# Patient Record
Sex: Female | Born: 1980 | Race: Black or African American | Hispanic: No | Marital: Married | Smoking: Never smoker
Health system: Southern US, Community
[De-identification: ages and names within clinical notes are randomized; demographics above are authoritative.]

## PROBLEM LIST (undated history)

## (undated) DIAGNOSIS — K439 Ventral hernia without obstruction or gangrene: Secondary | ICD-10-CM

## (undated) HISTORY — PX: BREAST SURGERY: SHX581

---
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
## 2001-06-17 ENCOUNTER — Encounter: Admission: RE | Admit: 2001-06-17 | Discharge: 2001-06-17 | Payer: Self-pay | Admitting: General Surgery

## 2001-06-17 ENCOUNTER — Encounter (HOSPITAL_BASED_OUTPATIENT_CLINIC_OR_DEPARTMENT_OTHER): Payer: Self-pay | Admitting: General Surgery

## 2001-06-19 ENCOUNTER — Ambulatory Visit (HOSPITAL_COMMUNITY): Admission: RE | Admit: 2001-06-19 | Discharge: 2001-06-19 | Payer: Self-pay | Admitting: Obstetrics & Gynecology

## 2001-07-29 ENCOUNTER — Ambulatory Visit (HOSPITAL_COMMUNITY): Admission: RE | Admit: 2001-07-29 | Discharge: 2001-07-29 | Payer: Self-pay | Admitting: Obstetrics

## 2001-12-18 ENCOUNTER — Encounter (HOSPITAL_COMMUNITY): Admission: AD | Admit: 2001-12-18 | Discharge: 2001-12-18 | Payer: Self-pay | Admitting: *Deleted

## 2001-12-23 ENCOUNTER — Inpatient Hospital Stay (HOSPITAL_COMMUNITY): Admission: AD | Admit: 2001-12-23 | Discharge: 2001-12-23 | Payer: Self-pay | Admitting: *Deleted

## 2001-12-24 ENCOUNTER — Inpatient Hospital Stay (HOSPITAL_COMMUNITY): Admission: AD | Admit: 2001-12-24 | Discharge: 2001-12-26 | Payer: Self-pay | Admitting: *Deleted

## 2004-11-26 ENCOUNTER — Encounter: Admission: RE | Admit: 2004-11-26 | Discharge: 2004-11-26 | Payer: Self-pay | Admitting: Occupational Medicine

## 2005-10-24 ENCOUNTER — Encounter: Admission: RE | Admit: 2005-10-24 | Discharge: 2005-10-24 | Payer: Self-pay | Admitting: Family Medicine

## 2005-12-11 ENCOUNTER — Encounter (INDEPENDENT_AMBULATORY_CARE_PROVIDER_SITE_OTHER): Payer: Self-pay | Admitting: *Deleted

## 2005-12-11 ENCOUNTER — Ambulatory Visit (HOSPITAL_BASED_OUTPATIENT_CLINIC_OR_DEPARTMENT_OTHER): Admission: RE | Admit: 2005-12-11 | Discharge: 2005-12-11 | Payer: Self-pay | Admitting: Surgery

## 2006-09-05 ENCOUNTER — Inpatient Hospital Stay (HOSPITAL_COMMUNITY): Admission: AD | Admit: 2006-09-05 | Discharge: 2006-09-06 | Payer: Self-pay | Admitting: Obstetrics and Gynecology

## 2007-02-28 ENCOUNTER — Inpatient Hospital Stay (HOSPITAL_COMMUNITY): Admission: AD | Admit: 2007-02-28 | Discharge: 2007-02-28 | Payer: Self-pay | Admitting: Obstetrics and Gynecology

## 2007-03-04 ENCOUNTER — Inpatient Hospital Stay (HOSPITAL_COMMUNITY): Admission: AD | Admit: 2007-03-04 | Discharge: 2007-03-06 | Payer: Self-pay | Admitting: Obstetrics and Gynecology

## 2007-04-10 ENCOUNTER — Ambulatory Visit (HOSPITAL_COMMUNITY): Admission: RE | Admit: 2007-04-10 | Discharge: 2007-04-10 | Payer: Self-pay | Admitting: Obstetrics and Gynecology

## 2008-05-03 ENCOUNTER — Emergency Department (HOSPITAL_COMMUNITY): Admission: EM | Admit: 2008-05-03 | Discharge: 2008-05-03 | Payer: Self-pay | Admitting: Emergency Medicine

## 2011-03-19 NOTE — Op Note (Signed)
[name], Caitlin Costa NO.:  [id number]   MEDICAL RECORD NO.:  [id number]          PATIENT TYPE:  AMB   LOCATION:  SDC                           FACILITY:  [facility]   PHYSICIAN:  Osborn Coho, M.D.   DATE OF BIRTH:  04-Mar-1981   DATE OF PROCEDURE:  04/10/2007  DATE OF DISCHARGE:                               OPERATIVE REPORT   PREOPERATIVE DIAGNOSIS:  Desires permanent sterilization.   POSTOPERATIVE DIAGNOSIS:  Desires permanent sterilization.   PROCEDURE:  Laparoscopic sterilization/BTL.   ATTENDING:  Osborn Coho, M.D.   ANESTHESIA:  General.   FLUIDS:  1000 mL   URINE OUTPUT:  Approximately 500 mL via straight cath prior to  procedure.   ESTIMATED BLOOD LOSS:  Minimal.   COMPLICATIONS:  None.   FINDINGS:  Normal bilateral fallopian tubes.  Normal-appearing left  ovary, unable to visualize as right ovary.   PROCEDURE:  The patient was taken to the operating room after risks,  benefits and alternatives reviewed with the patient.  The patient  verbalized understanding, consent signed and witnessed.  The patient was  placed under general anesthesia and prepped and draped in the normal  sterile fashion in the dorsal lithotomy position.  A bivalve speculum  placed in the patient's vagina and a Hulka tenaculum placed for  intrauterine manipulation. Attention was then turned to the abdomen  where a 10 mm vertical incision was made at the umbilicus after  injecting with 10 mL of 0.5% Marcaine. A 10 mm incision was made at the  umbilicus.  The Veress needle was introduced into the intra-abdominal  cavity and pneumoperitoneum achieved.  A 10-mm trocar was advanced into  the intra-abdominal cavity and laparoscope introduced as well.  The  patient was placed in Trendelenburg and with a probe bilateral adnexa  were attempted to be visualized. However, unable to elevate the uterus  enough to visualize the right ovary. However the right tube was able to  be lifted  up and the fimbriated end visualized.  Once bilateral fimbria  visualized the Kleppinger was used to cauterize for three consecutive  burns the right fallopian tube in the isthmic portion.  This was done  for at least three consecutive burns.  The same was done on the left  fallopian tube.  There was good hemostasis.  The patient was taken out  of Trendelenburg and pneumoperitoneum released.  The umbilical trocar  was removed under direct visualization and the fascia repaired at the  umbilicus using 0 Vicryl via a running stitch.  The  skin was repaired using 3-0 Monocryl via a subcuticular stitch.  Covaderm was placed.  Sponge, lap and needle count was correct.  A Hulka  tenaculum was removed.  The patient tolerated procedure well and was  awaiting transfer to the recovery room in good condition.      Osborn Coho, M.D.  Electronically Signed     AR/MEDQ  D:  04/10/2007  T:  04/10/2007  Job:  [id number]

## 2011-03-22 NOTE — H&P (Signed)
[name], Caitlin Costa               ACCOUNT NO.:  [id number]   MEDICAL RECORD NO.:  [id number]          PATIENT TYPE:  MAT   LOCATION:  MATC                          FACILITY:  [facility]   PHYSICIAN:  Naima A. Dillard, M.D. DATE OF BIRTH:  12-01-1980   DATE OF ADMISSION:  02/28/2007  DATE OF DISCHARGE:  02/28/2007                              HISTORY & PHYSICAL   Caitlin Costa is a 30 year old gravida 2, para 1-0-0-1 who presents at 40-  2/7 weeks, EDD March 02, 2007 as determined by dates and confirmed with  first trimester ultrasound.  She presents with contractions increasing  in frequency and intensity since 8 o'clock this morning.  She reports  positive fetal movement.  No vaginal bleeding.  No rupture of membranes.  She denies any headache, visual changes or epigastric pain.  Her  pregnancy has been followed by the [facility] at [facility] and is remarkable  for:   1. First trimester spotting.  2. History of rapid labor.  3. Group B strep negative.  4. Positive chlamydia, treated with Zithromax on February 11, 2007.  Test      of cure was done today on admission.   This patient presented to the office of [facility] for prenatal care on February 21, 2006 at [redacted] weeks gestation.  EDC determined by dates and confirmed  with ultrasound.  Her pregnancy has been essentially unremarkable.  She  has been size equal to dates.  Normotensive with no proteinuria.  Height  is 5 foot 10 inches.  Pre-gravid weight is 198.  Last recorded pregnancy  weight is 257.  She has no known drug allergies and denies the use of  tobacco, alcohol or illicit drugs.   OB HISTORY:  In 2003 the patient had a normal spontaneous vaginal  delivery at term with the birth of a 7 pound 4 ounce female infant named  Caitlin Costa, weight 7 pounds 4 ounces with no complications.  This is her  second and current pregnancy.   MEDICAL HISTORY:  The patient had a right breast lump removed in January  of 2007, which was benign.   FAMILY HISTORY:   Mother, stomach cancer, deceased at age 45.   GENETIC HISTORY:  Unremarkable.  There is no family history of familial  or chromosomal disorders, children that were born with birth defects or  any that died in infancy.   SOCIAL HISTORY:  Ms. Caitlin Costa is a married African-American female.  Her  husband, Caitlin Costa, is involved and supportive.  They are [place]  in their faith.   REVIEW OF SYSTEMS:  As described above.  The patient is typical of one  with a uterine pregnancy at term in early active labor.   PHYSICAL EXAMINATION:  VITAL SIGNS:  Stable.  Afebrile.  HEENT:  Unremarkable.  HEART:  Regular rate and rhythm.  LUNGS:  Clear.  ABDOMEN:  Gravid in its contour.  Uterine fundus is noted to extend 40  cm above the level of the pubic symphysis.  Leopold's maneuver finds the  infant to be in a longitudinal lie, cephalic presentation.  Estimated  fetal weight is 7-1/2 pounds.  The baseline of the fetal heart rate  monitor is 140s with average long-term variability.  Reactivity is  present with no periodic changes.  The patient is contracting every 2-3  minutes.  GYN:  Digital exam of the cervix finds it to be 4 cm dilated, 90%  effaced with the cephalic presenting part at a -1 station and a bulging  bag of water.  EXTREMITIES:  Show no pathologic edema.  DTRs are 1+ with no clonus.  There is no calf tenderness noted bilaterally.   ASSESSMENT:  Intrauterine pregnancy at term.  Active labor.   PLAN:  Admit per Dr. Jaymes Graff.  May have epidural as desired for  pain relief.  Anticipate spontaneous vaginal delivery.      Rica Koyanagi, C.N.M.      Naima A. Normand Sloop, M.D.  Electronically Signed    SDM/MEDQ  D:  03/04/2007  T:  03/04/2007  Job:  [id number]

## 2011-03-22 NOTE — Op Note (Signed)
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   OPERATION:  Excision right breast mass (partial mastectomy).   SURGEON:  Currie Paris, M.D.   ANESTHESIA:  General.   CLINICAL HISTORY:  This is a 30 year old with an extremely large fairly well  circumscribed right breast mass that has occupied most of the upper inner  quadrant of her right breast. 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   DESCRIPTION OF PROCEDURE:  The patient was seen in the holding area, and she  had no further questions. She and I both identified and marked the right  breast as the operative side. She was taken to the operating room and prior  to being given any anesthesia the mass was palpated, identified, and marked.  The patient then underwent a satisfactory general anesthesia (LMA). The  breast was then prepped and draped and the time-out occurred.   I made a slightly curving, but mainly transverse incision, in the upper  inner quadrant of the right breast staying below the lowest edge of the  palpable mass. As soon as I got through the skin and subcutaneous tissue the  mass was visible. I basically stripped it off of the subcutaneous tissue  superficially, especially going superior, then I was able to go around it  medial and using cautery divided it from where it seemed to be attached to  breast tissue, but it was  basically a self-contained mass. Some bleeders  were cauterized, others suture-ligated or tied as needed. The mass came out  apparently intact and en toto.   Once this was out, it was sent to pathology. The wound was checked for  hemostasis and once I thought everything was dry, injected with some  Marcaine to help with postoperative analgesia. I did close back the breast  tissue where there seemed to be some breast tissue both above-and-below  where this mass has and really occupied the entire one-fourth of the breast.  I did this to also try to tack this down to reduce the dead space and  hopefully reduce the chance  of a large seroma developing. The subcu was closed with some 3-0 Vicryl and  the skin with a 4-0 Monocryl subcuticular plus Dermabond.   The patient tolerated the procedure well. There no operative complications.  All counts were correct.      Currie Paris, M.D.  Electronically Signed     CJS/MEDQ  D:  12/11/2005  T:  12/11/2005  Job:  [id number]   cc:   Ernestina Penna, M.D.  Fax: [phone]

## 2011-08-22 LAB — CBC
HCT: 39.3
Hemoglobin: 13.1
MCHC: 33.3
RBC: 4.68

## 2011-11-29 ENCOUNTER — Emergency Department (HOSPITAL_BASED_OUTPATIENT_CLINIC_OR_DEPARTMENT_OTHER)
Admission: EM | Admit: 2011-11-29 | Discharge: 2011-11-29 | Disposition: A | Discharge status: Home / Self Care | Payer: 59 | Attending: Emergency Medicine | Admitting: Emergency Medicine

## 2011-11-29 ENCOUNTER — Encounter (HOSPITAL_BASED_OUTPATIENT_CLINIC_OR_DEPARTMENT_OTHER): Payer: Self-pay | Admitting: *Deleted

## 2011-11-29 DIAGNOSIS — K120 Recurrent oral aphthae: Secondary | ICD-10-CM

## 2011-11-29 DIAGNOSIS — K1370 Unspecified lesions of oral mucosa: Secondary | ICD-10-CM | POA: Insufficient documentation

## 2011-11-29 MED ORDER — CHLORHEXIDINE GLUCONATE 0.12 % MT SOLN: 15.0000 mL | Freq: Two times a day (BID) | OROMUCOSAL | Status: AC

## 2011-11-29 MED ORDER — LIDOCAINE VISCOUS 2 % MT SOLN: 5.0000 mL | Freq: Four times a day (QID) | OROMUCOSAL | Status: AC | PRN

## 2011-11-29 MED ORDER — IBUPROFEN 800 MG PO TABS
800.0000 mg | ORAL_TABLET | Freq: Once | ORAL | Status: AC
  Administered 2011-11-29: 800 mg via ORAL
  Filled 2011-11-29: qty 1

## 2011-11-29 NOTE — ED Notes (Signed)
Pt c/o canker sores inside mouth x 5 days

## 2011-11-29 NOTE — ED Notes (Signed)
MD at bedside. 

## 2011-11-29 NOTE — ED Provider Notes (Signed)
History     CSN: [id number]  Arrival date & time 11/29/11  2300   First MD Initiated Contact with Patient 11/29/11 2306      Chief Complaint  Patient presents with  . Mouth Lesions    (Consider location/radiation/quality/duration/timing/severity/associated sxs/prior treatment) Patient is a 31 y.o. female presenting with mouth sores. The history is provided by the patient.  Mouth Lesions  The current episode started 5 to 7 days ago. The problem occurs continuously. The problem has been unchanged. The problem is moderate. The symptoms are relieved by one or more OTC medications. The symptoms are aggravated by eating and drinking. Associated symptoms include mouth sores and swollen glands. Pertinent negatives include no congestion, no headaches, no rhinorrhea, no sore throat and no rash.    History reviewed. No pertinent past medical history.  Past Surgical History  Procedure Date  . Breast surgery     History reviewed. No pertinent family history.  History  Substance Use Topics  . Smoking status: Never Smoker   . Smokeless tobacco: Not on file  . Alcohol Use: No    OB History    Grav Para Term Preterm Abortions TAB SAB Ect Mult Living                  Review of Systems  HENT: Positive for mouth sores. Negative for congestion, sore throat and rhinorrhea.   Skin: Negative for pallor and rash.  Neurological: Negative for headaches.    Allergies  Review of patient's allergies indicates no known allergies.  Home Medications   Current Outpatient Rx  Name Route Sig Dispense Refill  . CHLORHEXIDINE GLUCONATE 0.12 % MT SOLN Mouth/Throat Use as directed 15 mLs in the mouth or throat 2 (two) times daily. 120 mL 0  . LIDOCAINE VISCOUS 2 % MT SOLN Oral Take 5 mLs by mouth 4 (four) times daily as needed for pain. Swish and spit out the solution before eating or drinking for the painful oral ulcers 100 mL 0    BP 107/84  Pulse 65  Temp(Src) 99.1 F (37.3 C) (Oral)  Resp  16  Ht 5\' 10"  (1.778 m)  Wt 192 lb (87.091 kg)  BMI 27.55 kg/m2  SpO2 100%  Physical Exam  Nursing note and vitals reviewed. Constitutional: She appears well-developed and well-nourished. No distress.  HENT:  Head: Normocephalic and atraumatic. No trismus in the jaw.  Right Ear: External ear normal.  Left Ear: External ear normal.  Mouth/Throat: Oral lesions present. Normal dentition. No uvula swelling or dental caries. No oropharyngeal exudate.       Erythematous ulcers on mucosa, gingiva margins irritated as well  Eyes: Conjunctivae are normal. Right eye exhibits no discharge. Left eye exhibits no discharge. No scleral icterus.  Neck: Neck supple. No tracheal deviation present.  Cardiovascular: Normal rate.   Pulmonary/Chest: Effort normal. No stridor. No respiratory distress.  Musculoskeletal: She exhibits no edema.  Neurological: She is alert. Cranial nerve deficit: no gross deficits.  Skin: Skin is warm and dry. No rash noted.  Psychiatric: She has a normal mood and affect.    ED Course  Procedures (including critical care time)  Labs Reviewed - No data to display No results found.   1. Canker sore       MDM  Pt treated with viscous lidocaine swish and spit.  Chlorhexidine mouth wash as well for symptomatic relief.  Likely viral.       Celene Kras, MD 11/29/11 [phone]

## 2014-06-17 ENCOUNTER — Emergency Department (HOSPITAL_BASED_OUTPATIENT_CLINIC_OR_DEPARTMENT_OTHER)
Admission: EM | Admit: 2014-06-17 | Discharge: 2014-06-18 | Disposition: A | Discharge status: Home / Self Care | Attending: Emergency Medicine | Admitting: Emergency Medicine

## 2014-06-17 ENCOUNTER — Encounter (HOSPITAL_BASED_OUTPATIENT_CLINIC_OR_DEPARTMENT_OTHER): Payer: Self-pay | Admitting: Emergency Medicine

## 2014-06-17 DIAGNOSIS — S8391XA Sprain of unspecified site of right knee, initial encounter: Secondary | ICD-10-CM

## 2014-06-17 DIAGNOSIS — Y990 Civilian activity done for income or pay: Secondary | ICD-10-CM | POA: Insufficient documentation

## 2014-06-17 DIAGNOSIS — S99929A Unspecified injury of unspecified foot, initial encounter: Secondary | ICD-10-CM

## 2014-06-17 DIAGNOSIS — S8990XA Unspecified injury of unspecified lower leg, initial encounter: Secondary | ICD-10-CM | POA: Diagnosis present

## 2014-06-17 DIAGNOSIS — Y9389 Activity, other specified: Secondary | ICD-10-CM | POA: Insufficient documentation

## 2014-06-17 DIAGNOSIS — Y9289 Other specified places as the place of occurrence of the external cause: Secondary | ICD-10-CM | POA: Diagnosis not present

## 2014-06-17 DIAGNOSIS — S99919A Unspecified injury of unspecified ankle, initial encounter: Secondary | ICD-10-CM

## 2014-06-17 DIAGNOSIS — X500XXA Overexertion from strenuous movement or load, initial encounter: Secondary | ICD-10-CM | POA: Insufficient documentation

## 2014-06-17 DIAGNOSIS — IMO0002 Reserved for concepts with insufficient information to code with codable children: Secondary | ICD-10-CM | POA: Insufficient documentation

## 2014-06-17 MED ORDER — NAPROXEN 250 MG PO TABS
500.0000 mg | ORAL_TABLET | Freq: Once | ORAL | Status: AC
  Administered 2014-06-17: 500 mg via ORAL
  Filled 2014-06-17: qty 2

## 2014-06-17 MED ORDER — HYDROCODONE-ACETAMINOPHEN 5-325 MG PO TABS: 1.0000 | ORAL_TABLET | Freq: Four times a day (QID) | ORAL | Status: DC | PRN

## 2014-06-17 NOTE — ED Provider Notes (Signed)
CSN: [id number]     Arrival date & time 06/17/14  2305 History  This chart was scribed for Hanley SeamenJohn L Kehaulani Fruin, MD by Julian HyMorgan Graham, ED Scribe. The patient was seen in MH09/MH09. The patient's care was started at 11:20 PM.     Chief Complaint  Patient presents with  . Knee Injury   The history is provided by the patient. No language interpreter was used.   HPI Comments: Caitlin Costa is a 33 y.o. female who presents to the Emergency Department complaining of right knee injury about 19 hours ago. Pt reports she was bending down at work and when she stood up she felt and heard a pop in her right knee. Pt denies any other imjury.  Pt reports she cannot bear any weight on her right knee without pain. Pt rates her pain as 8/10 when she bears weight. She describes the pain as being deep in her knee joint. Pt reports associated swelling earlier that has improved. Pt reports she used ice compress and elevation with minimal relief.    History reviewed. No pertinent past medical history. Past Surgical History  Procedure Laterality Date  . Breast surgery     History reviewed. No pertinent family history. History  Substance Use Topics  . Smoking status: Never Smoker   . Smokeless tobacco: Not on file  . Alcohol Use: No   OB History   Grav Para Term Preterm Abortions TAB SAB Ect Mult Living                 Review of Systems  Constitutional: Negative for fever and chills.  Gastrointestinal: Negative for nausea and vomiting.  Musculoskeletal: Positive for arthralgias (right knee) and joint swelling (right knee).  Neurological: Negative for weakness.  All other systems reviewed and are negative.  Allergies  Review of patient's allergies indicates no known allergies.  Home Medications   Prior to Admission medications   Medication Sig Start Date End Date Taking? Authorizing Provider  benzocaine (ORAJEL) 10 % mucosal gel Use as directed in the mouth or throat as needed. For mouth pain     Historical Provider, MD  HYDROcodone-acetaminophen (NORCO) 5-325 MG per tablet Take 1-2 tablets by mouth every 6 (six) hours as needed (for pain). 06/17/14   Carlisle BeersJohn L Everley Evora, MD  Multiple Vitamin (MULITIVITAMIN WITH MINERALS) TABS Take 1 tablet by mouth daily.    Historical Provider, MD   Triage Vitals: BP 122/62  Pulse 51  Temp(Src) 98.1 F (36.7 C) (Oral)  Resp 16  Ht 5\' 10"  (1.778 m)  Wt 212 lb (96.163 kg)  BMI 30.42 kg/m2  SpO2 100%  LMP 05/19/2014 Physical Exam  Nursing note and vitals reviewed. Constitutional: She is oriented to person, place, and time. She appears well-developed and well-nourished. No distress.  HENT:  Head: Normocephalic and atraumatic.  Eyes: Conjunctivae and EOM are normal.  Cardiovascular: Normal rate, regular rhythm and normal heart sounds.   Pulmonary/Chest: Effort normal and breath sounds normal. No stridor. No respiratory distress.  Abdominal: Soft. Bowel sounds are normal. She exhibits no distension.  Musculoskeletal: She exhibits no edema.  Pedal pulses are normal. She has mild pain on anterior and posterior drawer tests and on lateral stress without instability. No effusion appreciated.   Neurological: She is alert and oriented to person, place, and time. No cranial nerve deficit.  Skin: Skin is warm and dry.  Psychiatric: She has a normal mood and affect.    ED Course  Procedures (including critical care  time) DIAGNOSTIC STUDIES: Oxygen Saturation is 100% on RA, normal by my interpretation.    COORDINATION OF CARE: 11:26 PM- Patient informed of current plan for treatment and evaluation and agrees with plan at this time.   MDM   Final diagnoses:  Sprain of right knee, initial encounter   I personally performed the services described in this documentation, which was scribed in my presence. The recorded information has been reviewed and is accurate.   Hanley Seamen, MD 06/17/14 [phone]

## 2014-06-17 NOTE — ED Notes (Signed)
Pt states she was bending down last night at work and when she went to stand she heard a pop sound in her right knee, this afternoon she noticed it was swollen and painful to walk on

## 2014-07-26 ENCOUNTER — Ambulatory Visit: Attending: Orthopaedic Surgery | Admitting: Physical Therapy

## 2014-07-26 DIAGNOSIS — IMO0001 Reserved for inherently not codable concepts without codable children: Secondary | ICD-10-CM | POA: Insufficient documentation

## 2014-07-26 DIAGNOSIS — M2240 Chondromalacia patellae, unspecified knee: Secondary | ICD-10-CM | POA: Diagnosis not present

## 2014-07-26 DIAGNOSIS — M25569 Pain in unspecified knee: Secondary | ICD-10-CM | POA: Diagnosis not present

## 2014-07-26 DIAGNOSIS — M238X9 Other internal derangements of unspecified knee: Secondary | ICD-10-CM | POA: Diagnosis not present

## 2014-08-03 ENCOUNTER — Ambulatory Visit: Admitting: Rehabilitation

## 2014-08-09 ENCOUNTER — Ambulatory Visit: Payer: Worker's Compensation | Attending: Orthopaedic Surgery | Admitting: Physical Therapy

## 2014-08-12 ENCOUNTER — Ambulatory Visit: Payer: Worker's Compensation

## 2014-08-16 ENCOUNTER — Ambulatory Visit: Payer: Worker's Compensation | Admitting: Rehabilitation

## 2014-08-19 ENCOUNTER — Ambulatory Visit: Payer: Worker's Compensation | Admitting: Physical Therapy

## 2015-01-25 ENCOUNTER — Encounter (HOSPITAL_BASED_OUTPATIENT_CLINIC_OR_DEPARTMENT_OTHER): Payer: Self-pay | Admitting: Emergency Medicine

## 2015-01-25 ENCOUNTER — Emergency Department (HOSPITAL_BASED_OUTPATIENT_CLINIC_OR_DEPARTMENT_OTHER)
Admission: EM | Admit: 2015-01-25 | Discharge: 2015-01-25 | Disposition: A | Discharge status: Home / Self Care | Payer: Federal, State, Local not specified - PPO | Attending: Emergency Medicine | Admitting: Emergency Medicine

## 2015-01-25 DIAGNOSIS — Z79899 Other long term (current) drug therapy: Secondary | ICD-10-CM | POA: Insufficient documentation

## 2015-01-25 DIAGNOSIS — K439 Ventral hernia without obstruction or gangrene: Secondary | ICD-10-CM

## 2015-01-25 DIAGNOSIS — R1033 Periumbilical pain: Secondary | ICD-10-CM | POA: Diagnosis present

## 2015-01-25 MED ORDER — ONDANSETRON HCL 4 MG/2ML IJ SOLN
4.0000 mg | Freq: Once | INTRAMUSCULAR | Status: AC
  Administered 2015-01-25: 4 mg via INTRAVENOUS
  Filled 2015-01-25: qty 2

## 2015-01-25 MED ORDER — HYDROMORPHONE HCL 1 MG/ML IJ SOLN
1.0000 mg | Freq: Once | INTRAMUSCULAR | Status: AC
  Administered 2015-01-25: 1 mg via INTRAVENOUS
  Filled 2015-01-25: qty 1

## 2015-01-25 NOTE — ED Notes (Signed)
Pt reports that she has had an areas in her mid stomach area that felt weird for the last 1 1/2 years, last night the area got very hard feeling and she is having abdominal pain

## 2015-01-25 NOTE — ED Provider Notes (Signed)
CSN: [id number]     Arrival date & time 01/25/15  0510 History   First MD Initiated Contact with Patient 01/25/15 [phone]     Chief Complaint  Patient presents with  . Abdominal Pain     (Consider location/radiation/quality/duration/timing/severity/associated sxs/prior Treatment) HPI  This is a 34 year old female with a history of a supraumbilical mass past year and a half. It had never been painful in the past. This morning it became more prominent and became painful. She rates the pain as severe, worse with palpation or movement. She is nauseated but has had no vomiting. She has had no diarrhea. Her abdomen is not otherwise distended.  History reviewed. No pertinent past medical history. Past Surgical History  Procedure Laterality Date  . Breast surgery     History reviewed. No pertinent family history. History  Substance Use Topics  . Smoking status: Never Smoker   . Smokeless tobacco: Not on file  . Alcohol Use: No   OB History    No data available     Review of Systems  All other systems reviewed and are negative.   Allergies  Review of patient's allergies indicates no known allergies.  Home Medications   Prior to Admission medications   Medication Sig Start Date End Date Taking? Authorizing Provider  benzocaine (ORAJEL) 10 % mucosal gel Use as directed in the mouth or throat as needed. For mouth pain    Historical Provider, MD  HYDROcodone-acetaminophen (NORCO) 5-325 MG per tablet Take 1-2 tablets by mouth every 6 (six) hours as needed (for pain). 06/17/14   Shanti Agresti, MD  Multiple Vitamin (MULITIVITAMIN WITH MINERALS) TABS Take 1 tablet by mouth daily.    Historical Provider, MD   BP 136/61 mmHg  Pulse 62  Temp(Src) 98.1 F (36.7 C) (Oral)  Resp 18  Ht 5\' 10"  (1.778 m)  Wt 215 lb (97.523 kg)  BMI 30.85 kg/m2  SpO2 100%  LMP 01/25/2015   Physical Exam  General: Well-developed, well-nourished female in no acute distress; appearance consistent with age of  record HENT: normocephalic; atraumatic Eyes: pupils equal, round and reactive to light; extraocular muscles intact Neck: supple Heart: regular rate and rhythm Lungs: clear to auscultation bilaterally Abdomen: soft; nondistended; nontender; tender supraumbilical mass consistent with hernia; bowel sounds present Extremities: No deformity; full range of motion; pulses normal Neurologic: Awake, alert and oriented; motor function intact in all extremities and symmetric; no facial droop Skin: Warm and dry Psychiatric: Normal mood and affect    ED Course  Procedures (including critical care time)  CLOSED REDUCTION After informed verbal consent was obtained the patient was given 4 milligrams of Zofran IV and 1 milligram of Dilaudid IV. The patient's supraumbilical ventral hernia was reduced using gentle pressure. A pop was felt as the hernia slid inside the abdomen. The patient tolerated this well and there were no immediate complications.  MDM  6:03 AM Hernia remains reduced. Pain resolved. Will refer to [facility] for definitive treatment.    Paula LibraJohn Ferlando Lia, MD 01/25/15 [phone]

## 2015-05-09 ENCOUNTER — Encounter (HOSPITAL_BASED_OUTPATIENT_CLINIC_OR_DEPARTMENT_OTHER): Payer: Self-pay | Admitting: *Deleted

## 2015-05-09 DIAGNOSIS — K439 Ventral hernia without obstruction or gangrene: Secondary | ICD-10-CM | POA: Diagnosis not present

## 2015-05-09 DIAGNOSIS — R109 Unspecified abdominal pain: Secondary | ICD-10-CM | POA: Diagnosis present

## 2015-05-09 DIAGNOSIS — R110 Nausea: Secondary | ICD-10-CM | POA: Diagnosis not present

## 2015-05-09 DIAGNOSIS — Z79899 Other long term (current) drug therapy: Secondary | ICD-10-CM | POA: Diagnosis not present

## 2015-05-09 NOTE — ED Notes (Signed)
Pt. Reports abd. Hernia for several years with often times needing reduction in ED and she has self reduced.  Tonight Pt. Reports too much pain to try and reduce.  Pt. Has noted hardened area in mid abd. That is painful to touch per Pt.

## 2015-05-10 ENCOUNTER — Emergency Department (HOSPITAL_BASED_OUTPATIENT_CLINIC_OR_DEPARTMENT_OTHER)
Admission: EM | Admit: 2015-05-10 | Discharge: 2015-05-10 | Disposition: A | Discharge status: Home / Self Care | Payer: Federal, State, Local not specified - PPO | Attending: Emergency Medicine | Admitting: Emergency Medicine

## 2015-05-10 DIAGNOSIS — K439 Ventral hernia without obstruction or gangrene: Secondary | ICD-10-CM

## 2015-05-10 HISTORY — DX: Ventral hernia without obstruction or gangrene: K43.9

## 2015-05-10 MED ORDER — HYDROCODONE-ACETAMINOPHEN 5-325 MG PO TABS
ORAL_TABLET | ORAL | Status: AC
  Administered 2015-05-10: 2 via ORAL
  Filled 2015-05-10: qty 2

## 2015-05-10 MED ORDER — HYDROCODONE-ACETAMINOPHEN 5-325 MG PO TABS
1.0000 | ORAL_TABLET | Freq: Once | ORAL | Status: AC
  Administered 2015-05-10: 2 via ORAL

## 2015-05-10 MED ORDER — ONDANSETRON 4 MG PO TBDP
4.0000 mg | ORAL_TABLET | Freq: Once | ORAL | Status: AC
  Administered 2015-05-10: 4 mg via ORAL
  Filled 2015-05-10: qty 1

## 2015-05-10 NOTE — ED Provider Notes (Signed)
CSN: [id number]     Arrival date & time 05/09/15  2249 History   First MD Initiated Contact with Patient 05/10/15 0015     Chief Complaint  Patient presents with  . Abdominal Pain     (Consider location/radiation/quality/duration/timing/severity/associated sxs/prior Treatment) HPI Patient with known ventral hernia. States she was lifting heavy boxes at work and felt hernia come out around 9:30 PM. She started having immediate pain. She was unable to reduce it. Nausea but no vomiting. Past Medical History  Diagnosis Date  . Hernia of abdominal wall    Past Surgical History  Procedure Laterality Date  . Breast surgery     No family history on file. History  Substance Use Topics  . Smoking status: Never Smoker   . Smokeless tobacco: Not on file  . Alcohol Use: No   OB History    No data available     Review of Systems  Constitutional: Negative for fever and chills.  Gastrointestinal: Positive for nausea and abdominal pain. Negative for vomiting, diarrhea, constipation and abdominal distention.  Skin: Negative for rash and wound.  All other systems reviewed and are negative.     Allergies  Review of patient's allergies indicates no known allergies.  Home Medications   Prior to Admission medications   Medication Sig Start Date End Date Taking? Authorizing Provider  benzocaine (ORAJEL) 10 % mucosal gel Use as directed in the mouth or throat as needed. For mouth pain    Historical Provider, MD  HYDROcodone-acetaminophen (NORCO) 5-325 MG per tablet Take 1-2 tablets by mouth every 6 (six) hours as needed (for pain). 06/17/14   John Molpus, MD  Multiple Vitamin (MULITIVITAMIN WITH MINERALS) TABS Take 1 tablet by mouth daily.    Historical Provider, MD   BP 112/63 mmHg  Pulse 56  Temp(Src) 98.6 F (37 C) (Oral)  Resp 16  Ht 5\' 10"  (1.778 m)  Wt 205 lb (92.987 kg)  BMI 29.41 kg/m2  SpO2 99%  LMP 05/09/2015 Physical Exam  Constitutional: She is oriented to person,  place, and time. She appears well-developed and well-nourished. No distress.  HENT:  Head: Normocephalic and atraumatic.  Mouth/Throat: Oropharynx is clear and moist.  Eyes: EOM are normal. Pupils are equal, round, and reactive to light.  Neck: Normal range of motion. Neck supple.  Cardiovascular: Normal rate and regular rhythm.   Pulmonary/Chest: Effort normal and breath sounds normal. No respiratory distress. She has no wheezes. She has no rales.  Abdominal: Soft. Bowel sounds are normal. She exhibits mass. There is tenderness.  Central mass with tenderness to palpation. Abdomen is otherwise soft and nontender.  Musculoskeletal: Normal range of motion. She exhibits no edema or tenderness.  Neurological: She is alert and oriented to person, place, and time.  Skin: Skin is warm and dry. No rash noted. No erythema.  Psychiatric: She has a normal mood and affect. Her behavior is normal.  Nursing note and vitals reviewed.   ED Course  Procedures (including critical care time) Labs Review Labs Reviewed - No data to display  Imaging Review No results found.   EKG Interpretation None      MDM   Final diagnoses:  Ventral hernia without obstruction or gangrene   Hernia reduced in the emergency department. Patient is now symptom-free. Abdominal exam is benign. Advised to follow-up with general surgery. Return precautions given.     Loren Raceravid Derhonda Eastlick, MD 05/10/15 [phone]

## 2015-05-10 NOTE — Discharge Instructions (Signed)
Ventral Hernia A ventral hernia (also called an incisional hernia) is a hernia that occurs at the site of a previous surgical cut (incision) in the abdomen. The abdominal wall spans from your lower chest down to your pelvis. If the abdominal wall is weakened from a surgical incision, a hernia can occur. A hernia is a bulge of bowel or muscle tissue pushing out on the weakened part of the abdominal wall. Ventral hernias can get bigger from straining or lifting. Obese and older people are at higher risk for a ventral hernia. People who develop infections after surgery or require repeat incisions at the same site on the abdomen are also at increased risk. CAUSES  A ventral hernia occurs because of weakness in the abdominal wall at an incision site.  SYMPTOMS  Common symptoms include:  A visible bulge or lump on the abdominal wall.  Pain or tenderness around the lump.  Increased discomfort if you cough or make a sudden movement. If the hernia has blocked part of the intestine, a serious complication can occur (incarcerated or strangulated hernia). This can become a problem that requires emergency surgery because the blood flow to the blocked intestine may be cut off. Symptoms may include:  Feeling sick to your stomach (nauseous).  Throwing up (vomiting).  Stomach swelling (distention) or bloating.  Fever.  Rapid heartbeat. DIAGNOSIS  Your health care provider will take a medical history and perform a physical exam. Various tests may be ordered, such as:  Blood tests.  Urine tests.  Ultrasonography.  X-rays.  Computed tomography (CT). TREATMENT  Watchful waiting may be all that is needed for a smaller hernia that does not cause symptoms. Your health care provider may recommend the use of a supportive belt (truss) that helps to keep the abdominal wall intact. For larger hernias or those that cause pain, surgery to repair the hernia is usually recommended. If a hernia becomes  strangulated, emergency surgery needs to be done right away. HOME CARE INSTRUCTIONS  Avoid putting pressure or strain on the abdominal area.  Avoid heavy lifting.  Use good body positioning for physical tasks. Ask your health care provider about proper body positioning.  Use a supportive belt as directed by your health care provider.  Maintain a healthy weight.  Eat foods that are high in fiber, such as whole grains, fruits, and vegetables. Fiber helps prevent difficult bowel movements (constipation).  Drink enough fluids to keep your urine clear or pale yellow.  Follow up with your health care provider as directed. SEEK MEDICAL CARE IF:   Your hernia seems to be getting larger or more painful. SEEK IMMEDIATE MEDICAL CARE IF:   You have abdominal pain that is sudden and sharp.  Your pain becomes severe.  You have repeated vomiting.  You are sweating a lot.  You notice a rapid heartbeat.  You develop a fever. MAKE SURE YOU:   Understand these instructions.  Will watch your condition.  Will get help right away if you are not doing well or get worse. Document Released: 10/07/2012 Document Revised: 03/07/2014 Document Reviewed: 10/07/2012 Exit[employer] Patient Information 2015 [employer]. This information is not intended to replace advice given to you by your health care provider. Make sure you discuss any questions you have with your health care provider.  

## 2015-05-16 ENCOUNTER — Other Ambulatory Visit: Payer: Self-pay | Admitting: General Surgery

## 2015-05-16 DIAGNOSIS — K439 Ventral hernia without obstruction or gangrene: Secondary | ICD-10-CM

## 2015-05-16 DIAGNOSIS — K436 Other and unspecified ventral hernia with obstruction, without gangrene: Secondary | ICD-10-CM

## 2015-05-19 ENCOUNTER — Other Ambulatory Visit: Payer: Self-pay

## 2015-05-24 ENCOUNTER — Ambulatory Visit
Admission: RE | Admit: 2015-05-24 | Discharge: 2015-05-24 | Disposition: A | Discharge status: Home / Self Care | Payer: Federal, State, Local not specified - PPO | Source: Ambulatory Visit | Attending: General Surgery | Admitting: General Surgery

## 2015-05-24 MED ORDER — IOPAMIDOL (ISOVUE-300) INJECTION 61%
125.0000 mL | Freq: Once | INTRAVENOUS | Status: AC | PRN
  Administered 2015-05-24: 125 mL via INTRAVENOUS

## 2016-02-23 ENCOUNTER — Telehealth: Payer: Self-pay | Admitting: Behavioral Health

## 2016-02-23 NOTE — Telephone Encounter (Signed)
Unable to reach patient at time of Pre-Visit Call.  Left message for patient to return call when available.    

## 2016-02-26 ENCOUNTER — Ambulatory Visit: Payer: Federal, State, Local not specified - PPO | Admitting: Family Medicine

## 2016-02-26 ENCOUNTER — Telehealth: Payer: Self-pay | Admitting: Family Medicine

## 2016-02-26 DIAGNOSIS — Z0289 Encounter for other administrative examinations: Secondary | ICD-10-CM

## 2016-02-27 ENCOUNTER — Encounter: Payer: Self-pay | Admitting: Family Medicine

## 2016-02-27 NOTE — Telephone Encounter (Signed)
Pt was no  Show 02/26/16 1 :30pm for new pt appt, pt has not rescheduled, charge or no charge? Reschedule with you if pt calls?

## 2016-02-27 NOTE — Telephone Encounter (Signed)
Charge and do not reschedule

## 2016-02-27 NOTE — Telephone Encounter (Signed)
Marked not to reschedule with Dr. Zola ButtonLowne-Chase, marked to charge, mailing no show letter

## 2016-03-09 IMAGING — CT CT ABD-PELV W/ CM
3 of 4 series · 13 of 36 positions shown, 19 images · IV contrast (READICAT/WATER & [ID] ISOVUE 300)
Comparison: None

CLINICAL DATA: Ventral hernia and reduction, history tubal ligation

EXAM:
CT ABDOMEN AND PELVIS WITH CONTRAST
TECHNIQUE: Multidetector CT imaging of the abdomen and pelvis was performed
using the standard protocol following bolus administration of
intravenous contrast. Sagittal and coronal MPR images reconstructed
from axial data set.
CONTRAST:  125mL AKSKWW-A00 IOPAMIDOL (AKSKWW-A00) INJECTION 61% IV.
Dilute oral contrast.

[Series 3: abd/pelvis with · axial · 0.86mm/px · z∈[-406,-56]mm · 8 of 90 slices shown, 13 images]
[im 10/90  soft-tissue]
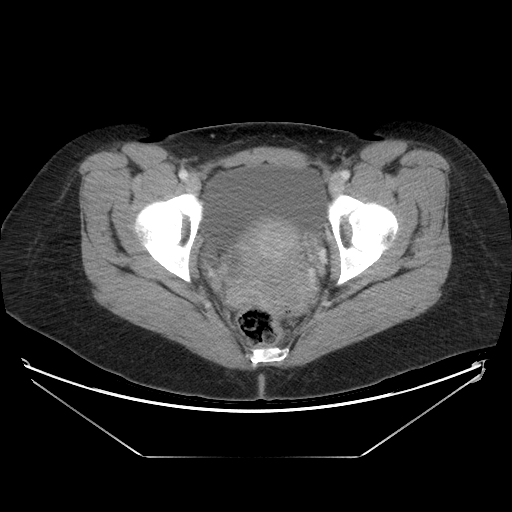
[im 10/90  bone]
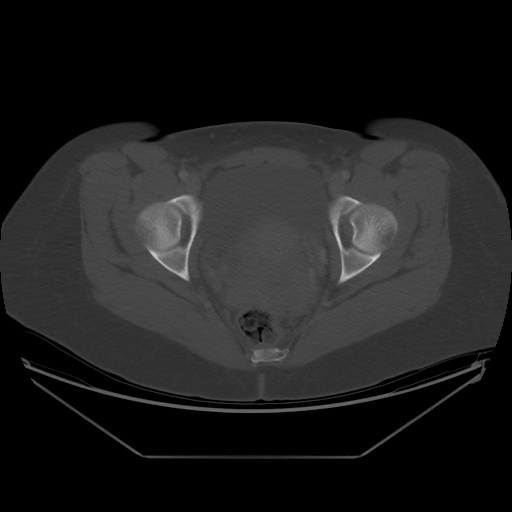
[im 20/90  soft-tissue]
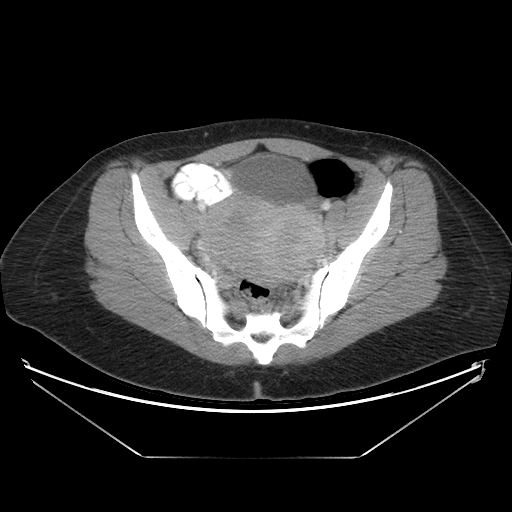
[im 30/90  soft-tissue]
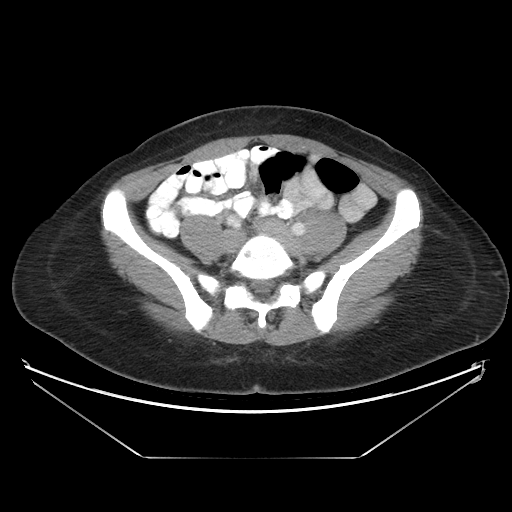
[im 40/90  soft-tissue]
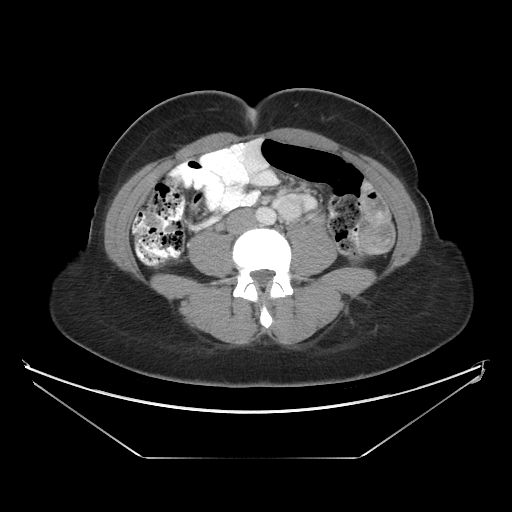
[im 50/90  soft-tissue]
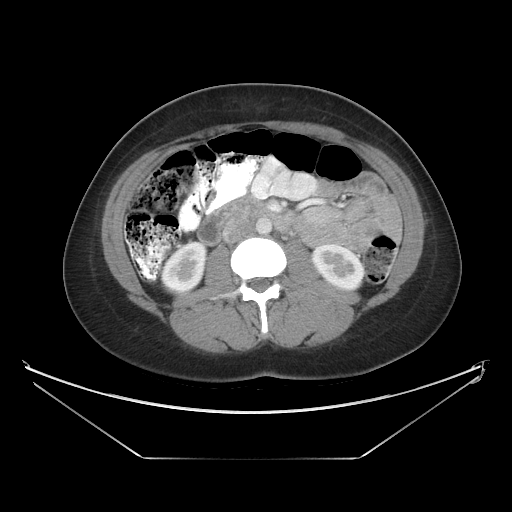
[im 50/90  lung]
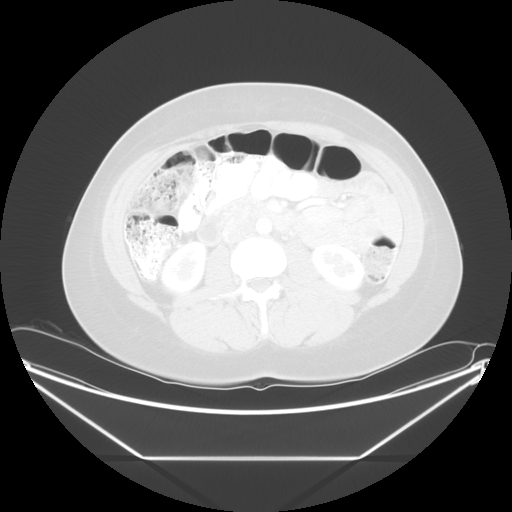
[im 60/90  soft-tissue]
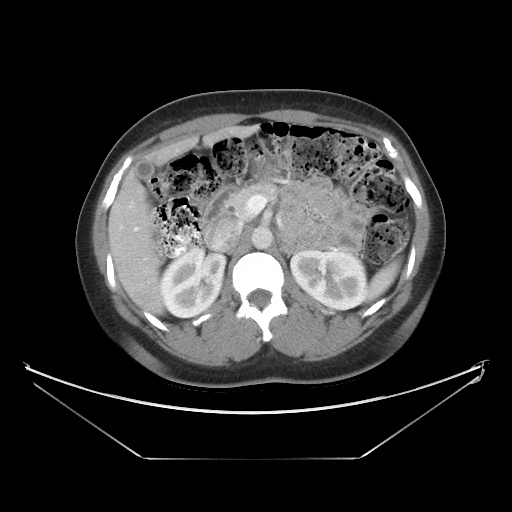
[im 60/90  lung]
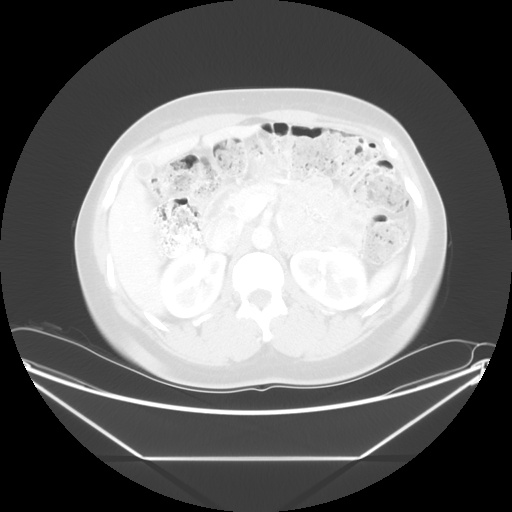
[im 70/90  soft-tissue]
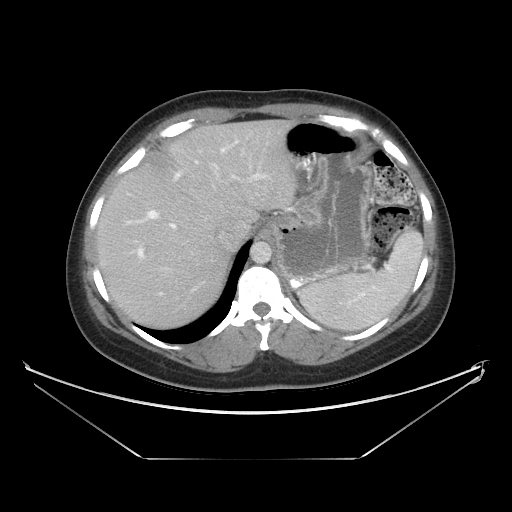
[im 70/90  lung]
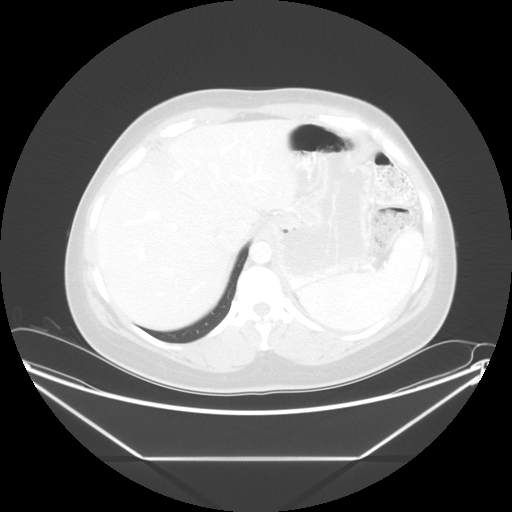
[im 80/90  soft-tissue]
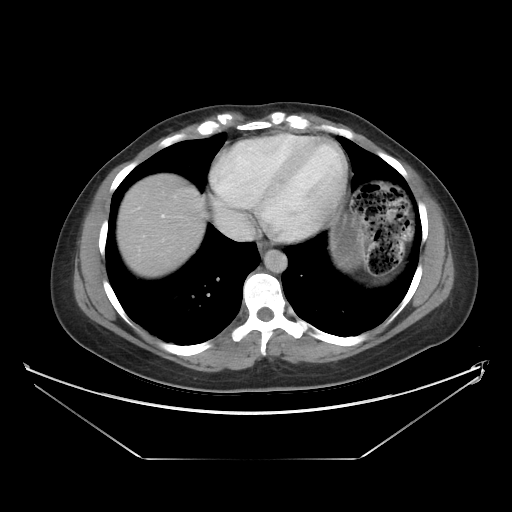
[im 80/90  lung]
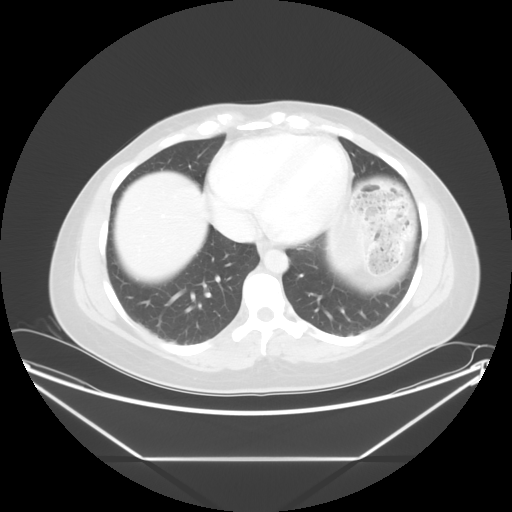

[Series 601: coronal body · coronal · 0.97mm/px · 1 of 117 slices shown, 2 images]
[im 39/117  soft-tissue]
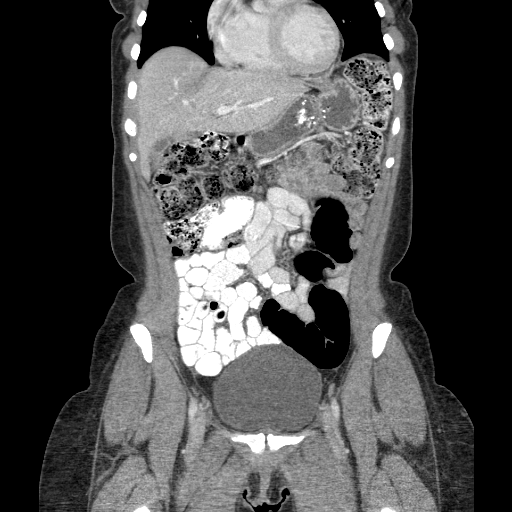
[im 39/117  bone]
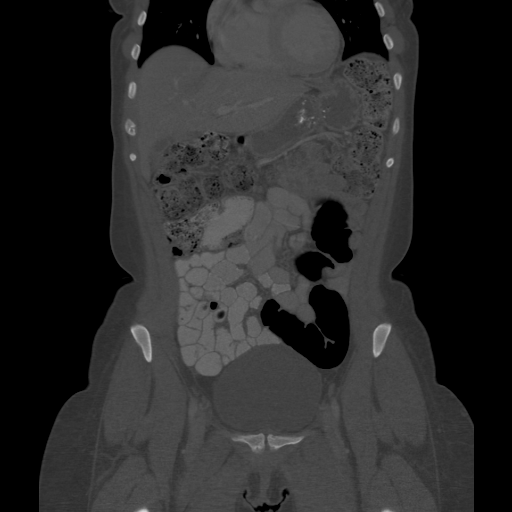

[Series 602: sagittal body · sagittal · 0.97mm/px · 4 of 178 slices shown]
[im 20/178  soft-tissue]
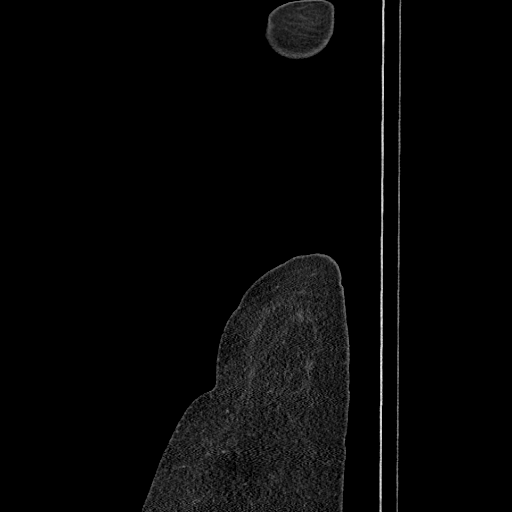
[im 40/178  soft-tissue]
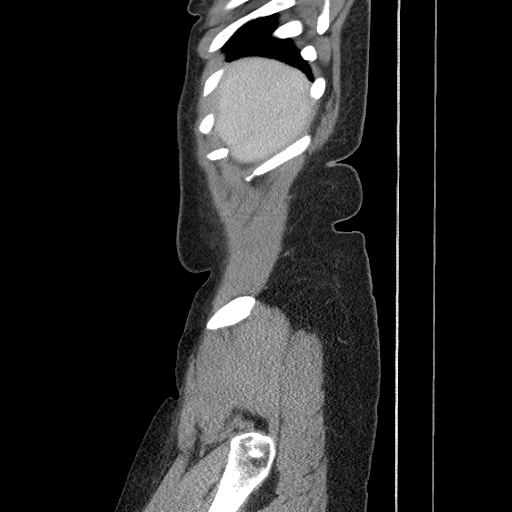
[im 60/178  soft-tissue]
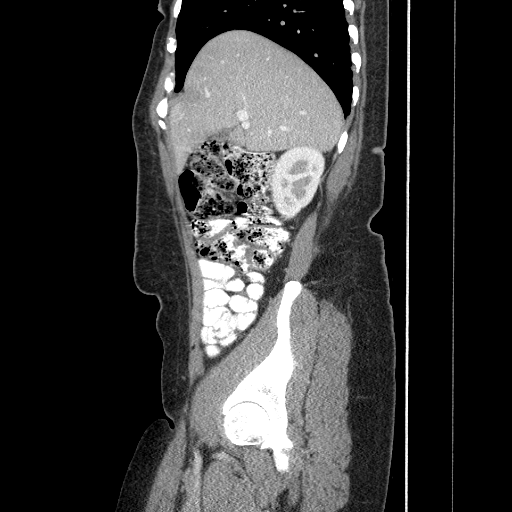
[im 79/178  soft-tissue]
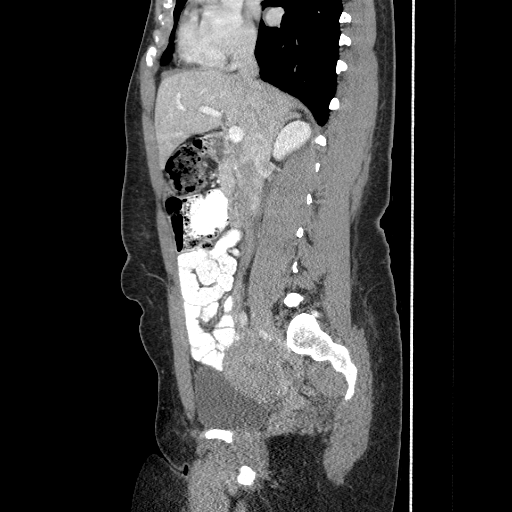

[13 of 36 positions shown; findings below may reference images not displayed]

FINDINGS: Lung bases clear.

Liver, gallbladder, spleen, pancreas, kidneys, and adrenal glands
normal.

Normal appendix.

Small supraumbilical fashion with defect 11 x 8 mm with herniation
of a small amount of fat.

Additional small umbilical hernia containing fat.

No bowel herniation or additional fascial defects identified.

Normal appendix.

Increased stool in colon.

Stomach and bowel loops otherwise normal appearance.

Enlarged uterus containing a 7.5 x 6.2 cm diameter mass at LEFT
lateral aspect likely large leiomyoma.

Unremarkable bladder and adnexa.

No mass, adenopathy, free air, free fluid, or acute osseous
findings.
IMPRESSION: Small umbilical and supraumbilical ventral hernias containing fat.

Probable large leiomyoma within uterus 7.5 cm diameter.

## 2018-05-31 ENCOUNTER — Inpatient Hospital Stay (HOSPITAL_COMMUNITY)
Admission: EM | Admit: 2018-05-31 | Discharge: 2018-06-01 | DRG: 355 | Disposition: A | Discharge status: Home / Self Care | Payer: Federal, State, Local not specified - PPO | Attending: General Surgery | Admitting: General Surgery

## 2018-05-31 ENCOUNTER — Other Ambulatory Visit: Payer: Self-pay

## 2018-05-31 ENCOUNTER — Encounter (HOSPITAL_COMMUNITY): Admission: EM | Disposition: A | Discharge status: Home / Self Care | Payer: Self-pay | Source: Home / Self Care

## 2018-05-31 ENCOUNTER — Inpatient Hospital Stay (HOSPITAL_COMMUNITY): Payer: Federal, State, Local not specified - PPO | Admitting: Certified Registered Nurse Anesthetist

## 2018-05-31 ENCOUNTER — Emergency Department (HOSPITAL_COMMUNITY): Payer: Federal, State, Local not specified - PPO

## 2018-05-31 DIAGNOSIS — K436 Other and unspecified ventral hernia with obstruction, without gangrene: Principal | ICD-10-CM | POA: Diagnosis present

## 2018-05-31 DIAGNOSIS — K469 Unspecified abdominal hernia without obstruction or gangrene: Secondary | ICD-10-CM

## 2018-05-31 DIAGNOSIS — K460 Unspecified abdominal hernia with obstruction, without gangrene: Secondary | ICD-10-CM | POA: Diagnosis present

## 2018-05-31 HISTORY — PX: LAPAROTOMY: SHX154

## 2018-05-31 HISTORY — PX: LAPAROSCOPY: SHX197

## 2018-05-31 LAB — CBC WITH DIFFERENTIAL/PLATELET
BASOS ABS: 0 10*3/uL (ref 0.0–0.1)
BASOS PCT: 0 %
EOS ABS: 0 10*3/uL (ref 0.0–0.7)
Eosinophils Relative: 0 %
HCT: 33.5 % — ABNORMAL LOW (ref 36.0–46.0)
Hemoglobin: 10.7 g/dL — ABNORMAL LOW (ref 12.0–15.0)
Lymphocytes Relative: 15 %
Lymphs Abs: 1.2 10*3/uL (ref 0.7–4.0)
MCH: 24.8 pg — ABNORMAL LOW (ref 26.0–34.0)
MCHC: 31.9 g/dL (ref 30.0–36.0)
MCV: 77.5 fL — ABNORMAL LOW (ref 78.0–100.0)
MONOS PCT: 2 %
Monocytes Absolute: 0.2 10*3/uL (ref 0.1–1.0)
NEUTROS PCT: 83 %
Neutro Abs: 7 10*3/uL (ref 1.7–7.7)
Platelets: 173 10*3/uL (ref 150–400)
RBC: 4.32 MIL/uL (ref 3.87–5.11)
RDW: 14.3 % (ref 11.5–15.5)
WBC: 8.5 10*3/uL (ref 4.0–10.5)

## 2018-05-31 LAB — COMPREHENSIVE METABOLIC PANEL
ALK PHOS: 56 U/L (ref 38–126)
ALT: 21 U/L (ref 0–44)
AST: 25 U/L (ref 15–41)
Albumin: 4 g/dL (ref 3.5–5.0)
Anion gap: 9 (ref 5–15)
BILIRUBIN TOTAL: 0.5 mg/dL (ref 0.3–1.2)
BUN: 14 mg/dL (ref 6–20)
CALCIUM: 9.1 mg/dL (ref 8.9–10.3)
CO2: 22 mmol/L (ref 22–32)
CREATININE: 0.94 mg/dL (ref 0.44–1.00)
Chloride: 108 mmol/L (ref 98–111)
Glucose, Bld: 123 mg/dL — ABNORMAL HIGH (ref 70–99)
Potassium: 4 mmol/L (ref 3.5–5.1)
SODIUM: 139 mmol/L (ref 135–145)
TOTAL PROTEIN: 7.4 g/dL (ref 6.5–8.1)

## 2018-05-31 LAB — PREGNANCY, URINE: PREG TEST UR: NEGATIVE

## 2018-05-31 SURGERY — LAPAROSCOPY, DIAGNOSTIC
Anesthesia: General | Site: Abdomen

## 2018-05-31 MED ORDER — HYDROMORPHONE HCL 1 MG/ML IJ SOLN: 1.0000 mg | INTRAMUSCULAR | Status: DC | PRN

## 2018-05-31 MED ORDER — BUPIVACAINE-EPINEPHRINE 0.25% -1:200000 IJ SOLN
INTRAMUSCULAR | Status: DC | PRN
  Administered 2018-05-31: 18 mL

## 2018-05-31 MED ORDER — DEXAMETHASONE SODIUM PHOSPHATE 10 MG/ML IJ SOLN
INTRAMUSCULAR | Status: DC | PRN
  Administered 2018-05-31: 5 mg via INTRAVENOUS

## 2018-05-31 MED ORDER — FENTANYL CITRATE (PF) 100 MCG/2ML IJ SOLN
100.0000 ug | INTRAMUSCULAR | Status: DC | PRN
  Administered 2018-05-31 (×3): 100 ug via INTRAVENOUS
  Filled 2018-05-31 (×3): qty 2

## 2018-05-31 MED ORDER — PROMETHAZINE HCL 25 MG/ML IJ SOLN: 6.2500 mg | INTRAMUSCULAR | Status: DC | PRN

## 2018-05-31 MED ORDER — DEXTROSE-NACL 5-0.9 % IV SOLN
INTRAVENOUS | Status: DC
  Administered 2018-05-31 – 2018-06-01 (×2): via INTRAVENOUS

## 2018-05-31 MED ORDER — SUGAMMADEX SODIUM 200 MG/2ML IV SOLN
INTRAVENOUS | Status: DC | PRN
  Administered 2018-05-31: 200 mg via INTRAVENOUS

## 2018-05-31 MED ORDER — ONDANSETRON 4 MG PO TBDP: 4.0000 mg | ORAL_TABLET | Freq: Four times a day (QID) | ORAL | Status: DC | PRN

## 2018-05-31 MED ORDER — ONDANSETRON HCL 4 MG/2ML IJ SOLN
4.0000 mg | Freq: Four times a day (QID) | INTRAMUSCULAR | Status: DC | PRN
Start: 2018-05-31 — End: 2018-06-01

## 2018-05-31 MED ORDER — SODIUM CHLORIDE 0.9 % IV BOLUS
1000.0000 mL | Freq: Once | INTRAVENOUS | Status: AC
  Administered 2018-05-31: 1000 mL via INTRAVENOUS

## 2018-05-31 MED ORDER — CELECOXIB 200 MG PO CAPS
400.0000 mg | ORAL_CAPSULE | ORAL | Status: AC
  Administered 2018-06-01: 400 mg via ORAL
  Filled 2018-05-31: qty 2

## 2018-05-31 MED ORDER — ONDANSETRON HCL 4 MG/2ML IJ SOLN
4.0000 mg | Freq: Once | INTRAMUSCULAR | Status: AC
  Administered 2018-05-31: 4 mg via INTRAVENOUS
  Filled 2018-05-31: qty 2

## 2018-05-31 MED ORDER — HYDROMORPHONE HCL 1 MG/ML IJ SOLN: 0.2500 mg | INTRAMUSCULAR | Status: DC | PRN

## 2018-05-31 MED ORDER — TRAMADOL HCL 50 MG PO TABS
50.0000 mg | ORAL_TABLET | Freq: Four times a day (QID) | ORAL | Status: DC | PRN
  Administered 2018-06-01: 50 mg via ORAL
  Filled 2018-05-31: qty 1

## 2018-05-31 MED ORDER — ONDANSETRON HCL 4 MG/2ML IJ SOLN
INTRAMUSCULAR | Status: DC | PRN
  Administered 2018-05-31: 4 mg via INTRAVENOUS

## 2018-05-31 MED ORDER — HYDRALAZINE HCL 20 MG/ML IJ SOLN: 10.0000 mg | INTRAMUSCULAR | Status: DC | PRN

## 2018-05-31 MED ORDER — IOPAMIDOL (ISOVUE-300) INJECTION 61%
100.0000 mL | Freq: Once | INTRAVENOUS | Status: AC | PRN
  Administered 2018-05-31: 100 mL via INTRAVENOUS

## 2018-05-31 MED ORDER — GABAPENTIN 300 MG PO CAPS
300.0000 mg | ORAL_CAPSULE | ORAL | Status: AC
  Administered 2018-06-01: 300 mg via ORAL
  Filled 2018-05-31: qty 1

## 2018-05-31 MED ORDER — ACETAMINOPHEN 10 MG/ML IV SOLN
INTRAVENOUS | Status: DC | PRN
  Administered 2018-05-31: 1000 mg via INTRAVENOUS

## 2018-05-31 MED ORDER — SUCCINYLCHOLINE CHLORIDE 20 MG/ML IJ SOLN
INTRAMUSCULAR | Status: DC | PRN
  Administered 2018-05-31: 120 mg via INTRAVENOUS

## 2018-05-31 MED ORDER — PHENYLEPHRINE 40 MCG/ML (10ML) SYRINGE FOR IV PUSH (FOR BLOOD PRESSURE SUPPORT)
PREFILLED_SYRINGE | INTRAVENOUS | Status: DC | PRN
  Administered 2018-05-31: 80 ug via INTRAVENOUS

## 2018-05-31 MED ORDER — MIDAZOLAM HCL 2 MG/2ML IJ SOLN
INTRAMUSCULAR | Status: AC
  Filled 2018-05-31: qty 2

## 2018-05-31 MED ORDER — PROPOFOL 10 MG/ML IV BOLUS
INTRAVENOUS | Status: DC | PRN
  Administered 2018-05-31: 160 mg via INTRAVENOUS

## 2018-05-31 MED ORDER — ACETAMINOPHEN 500 MG PO TABS
1000.0000 mg | ORAL_TABLET | ORAL | Status: AC
  Administered 2018-06-01: 1000 mg via ORAL
  Filled 2018-05-31: qty 2

## 2018-05-31 MED ORDER — 0.9 % SODIUM CHLORIDE (POUR BTL) OPTIME
TOPICAL | Status: DC | PRN
  Administered 2018-05-31: 1000 mL

## 2018-05-31 MED ORDER — KETOROLAC TROMETHAMINE 30 MG/ML IJ SOLN: 30.0000 mg | Freq: Once | INTRAMUSCULAR | Status: DC | PRN

## 2018-05-31 MED ORDER — LIDOCAINE 2% (20 MG/ML) 5 ML SYRINGE
INTRAMUSCULAR | Status: DC | PRN
  Administered 2018-05-31: 60 mg via INTRAVENOUS

## 2018-05-31 MED ORDER — MIDAZOLAM HCL 5 MG/5ML IJ SOLN
INTRAMUSCULAR | Status: DC | PRN
  Administered 2018-05-31: 2 mg via INTRAVENOUS

## 2018-05-31 MED ORDER — CEFAZOLIN SODIUM-DEXTROSE 2-4 GM/100ML-% IV SOLN
INTRAVENOUS | Status: AC
  Filled 2018-05-31: qty 100

## 2018-05-31 MED ORDER — ACETAMINOPHEN 10 MG/ML IV SOLN
INTRAVENOUS | Status: AC
  Filled 2018-05-31: qty 100

## 2018-05-31 MED ORDER — FENTANYL CITRATE (PF) 250 MCG/5ML IJ SOLN
INTRAMUSCULAR | Status: AC
Start: 2018-05-31 — End: ?
  Filled 2018-05-31: qty 5

## 2018-05-31 MED ORDER — ROCURONIUM BROMIDE 10 MG/ML (PF) SYRINGE
PREFILLED_SYRINGE | INTRAVENOUS | Status: DC | PRN
  Administered 2018-05-31: 40 mg via INTRAVENOUS

## 2018-05-31 MED ORDER — PROPOFOL 10 MG/ML IV BOLUS
INTRAVENOUS | Status: AC
  Filled 2018-05-31: qty 20

## 2018-05-31 MED ORDER — LACTATED RINGERS IV SOLN
INTRAVENOUS | Status: DC | PRN
  Administered 2018-05-31 (×2): via INTRAVENOUS

## 2018-05-31 MED ORDER — BUPIVACAINE-EPINEPHRINE (PF) 0.25% -1:200000 IJ SOLN
INTRAMUSCULAR | Status: AC
  Filled 2018-05-31: qty 30

## 2018-05-31 MED ORDER — SODIUM CHLORIDE 0.9 % IV SOLN
INTRAVENOUS | Status: DC
  Administered 2018-05-31: 09:00:00 via INTRAVENOUS

## 2018-05-31 SURGICAL SUPPLY — 31 items
ADH SKN CLS APL DERMABOND .7 (GAUZE/BANDAGES/DRESSINGS) ×1
CHLORAPREP W/TINT 26ML (MISCELLANEOUS) ×3 IMPLANT
DECANTER SPIKE VIAL GLASS SM (MISCELLANEOUS) IMPLANT
DERMABOND ADVANCED (GAUZE/BANDAGES/DRESSINGS) ×2
DERMABOND ADVANCED .7 DNX12 (GAUZE/BANDAGES/DRESSINGS) ×1 IMPLANT
DRAPE LAPAROSCOPIC ABDOMINAL (DRAPES) ×3 IMPLANT
ELECT REM PT RETURN 15FT ADLT (MISCELLANEOUS) ×3 IMPLANT
GAUZE SPONGE 2X2 8PLY STRL LF (GAUZE/BANDAGES/DRESSINGS) ×1 IMPLANT
GLOVE BIO SURGEON STRL SZ7.5 (GLOVE) ×3 IMPLANT
GOWN STRL REUS W/TWL XL LVL3 (GOWN DISPOSABLE) ×6 IMPLANT
HANDLE SUCTION POOLE (INSTRUMENTS) ×1 IMPLANT
IRRIG SUCT STRYKERFLOW 2 WTIP (MISCELLANEOUS)
IRRIGATION SUCT STRKRFLW 2 WTP (MISCELLANEOUS) IMPLANT
KIT BASIN OR (CUSTOM PROCEDURE TRAY) ×3 IMPLANT
MARKER SKIN DUAL TIP RULER LAB (MISCELLANEOUS) ×3 IMPLANT
NDL INSUFFLATION 14GA 120MM (NEEDLE) ×1 IMPLANT
NEEDLE INSUFFLATION 14GA 120MM (NEEDLE) ×3 IMPLANT
SHEARS HARMONIC ACE PLUS 36CM (ENDOMECHANICALS) IMPLANT
SLEEVE XCEL OPT CAN 5 100 (ENDOMECHANICALS) ×3 IMPLANT
SOLUTION ANTI FOG 6CC (MISCELLANEOUS) ×3 IMPLANT
SPONGE GAUZE 2X2 STER 10/PKG (GAUZE/BANDAGES/DRESSINGS) ×2
SUCTION POOLE HANDLE (INSTRUMENTS) ×3
SUT NOVA NAB DX-16 0-1 5-0 T12 (SUTURE) ×4 IMPLANT
SUT VIC AB 4-0 PS2 27 (SUTURE) IMPLANT
TOWEL OR 17X26 10 PK STRL BLUE (TOWEL DISPOSABLE) ×6 IMPLANT
TOWEL OR NON WOVEN STRL DISP B (DISPOSABLE) ×3 IMPLANT
TRAY LAPAROSCOPIC (CUSTOM PROCEDURE TRAY) ×3 IMPLANT
TROCAR BLADELESS OPT 5 100 (ENDOMECHANICALS) ×3 IMPLANT
TROCAR XCEL BLUNT TIP 100MML (ENDOMECHANICALS) IMPLANT
TROCAR XCEL NON-BLD 11X100MML (ENDOMECHANICALS) IMPLANT
TROCAR XCEL NON-BLD 5MMX100MML (ENDOMECHANICALS) ×4 IMPLANT

## 2018-05-31 NOTE — Transfer of Care (Signed)
Immediate Anesthesia Transfer of Care Note  Patient: Caitlin BurtonDeneen T Basden  Procedure(s) Performed: LAPAROSCOPY DIAGNOSTIC WITH PRIMARY INCARCERATED VENTRAL HERNIA REPAIR (N/A Abdomen) EXPLORATORY LAPAROTOMY (N/A Abdomen)  Patient Location: PACU  Anesthesia Type:General  Level of Consciousness: sedated, patient cooperative and responds to stimulation  Airway & Oxygen Therapy: Patient Spontanous Breathing and Patient connected to face mask oxygen  Post-op Assessment: Report given to RN and Post -op Vital signs reviewed and stable  Post vital signs: Reviewed and stable  Last Vitals:  Vitals Value Taken Time  BP 131/67 05/31/2018  3:00 PM  Temp    Pulse 55 05/31/2018  3:04 PM  Resp 28 05/31/2018  3:04 PM  SpO2 100 % 05/31/2018  3:04 PM  Vitals shown include unvalidated device data.  Last Pain:  Vitals:   05/31/18 1230  TempSrc:   PainSc: 9          Complications: No apparent anesthesia complications

## 2018-05-31 NOTE — ED Triage Notes (Signed)
She states she has a known umbilical hernia (for ~ 2 years). She states she can normally reduce it when it becomes painful. Since yesterday, she has been unable to reduce the somewhat enlarged hernia.

## 2018-05-31 NOTE — Anesthesia Postprocedure Evaluation (Signed)
Anesthesia Post Note  Patient: Caitlin Costa  Procedure(s) Performed: LAPAROSCOPY DIAGNOSTIC WITH PRIMARY INCARCERATED VENTRAL HERNIA REPAIR (N/A Abdomen) EXPLORATORY LAPAROTOMY (N/A Abdomen)     Patient location during evaluation: PACU Anesthesia Type: General Level of consciousness: awake and alert Pain management: pain level controlled Vital Signs Assessment: post-procedure vital signs reviewed and stable Respiratory status: spontaneous breathing, nonlabored ventilation, respiratory function stable and patient connected to nasal cannula oxygen Cardiovascular status: blood pressure returned to baseline and stable Postop Assessment: no apparent nausea or vomiting Anesthetic complications: no    Last Vitals:  Vitals:   05/31/18 1230 05/31/18 1500  BP: 127/80 131/67  Pulse: 67 66  Resp: 18 (!) 29  Temp:  37.1 C  SpO2: 100% (P) 100%    Last Pain:  Vitals:   05/31/18 1230  TempSrc:   PainSc: 9                  Brayen Bunn S

## 2018-05-31 NOTE — ED Notes (Signed)
Pt given ice pack for hernia per MD verbal order.

## 2018-05-31 NOTE — ED Notes (Signed)
Patient transported to CT 

## 2018-05-31 NOTE — Anesthesia Preprocedure Evaluation (Signed)
Anesthesia Evaluation  Patient identified by MRN, date of birth, ID band Patient awake    Reviewed: Allergy & Precautions, NPO status , Patient's Chart, lab work & pertinent test results  Airway Mallampati: II  TM Distance: >3 FB Neck ROM: Full    Dental no notable dental hx.    Pulmonary neg pulmonary ROS,    Pulmonary exam normal breath sounds clear to auscultation       Cardiovascular negative cardio ROS Normal cardiovascular exam Rhythm:Regular Rate:Normal     Neuro/Psych negative neurological ROS  negative psych ROS   GI/Hepatic negative GI ROS, Neg liver ROS,   Endo/Other  negative endocrine ROS  Renal/GU negative Renal ROS  negative genitourinary   Musculoskeletal negative musculoskeletal ROS (+)   Abdominal   Peds negative pediatric ROS (+)  Hematology negative hematology ROS (+)   Anesthesia Other Findings   Reproductive/Obstetrics negative OB ROS                             Anesthesia Physical Anesthesia Plan  ASA: II and emergent  Anesthesia Plan: General   Post-op Pain Management:    Induction: Intravenous and Rapid sequence  PONV Risk Score and Plan: 3 and Ondansetron, Dexamethasone, Midazolam and Treatment may vary due to age or medical condition  Airway Management Planned: Oral ETT  Additional Equipment:   Intra-op Plan:   Post-operative Plan: Extubation in OR  Informed Consent: I have reviewed the patients History and Physical, chart, labs and discussed the procedure including the risks, benefits and alternatives for the proposed anesthesia with the patient or authorized representative who has indicated his/her understanding and acceptance.     Dental advisory given  Plan Discussed with: CRNA and Surgeon  Anesthesia Plan Comments:         Anesthesia Quick Evaluation  

## 2018-05-31 NOTE — ED Provider Notes (Signed)
[facility]-EMERGENCY DEPT Provider Note   CSN: [id number] Arrival date & time: 05/31/18  0749     History   Chief Complaint Chief Complaint  Patient presents with  . Hernia    HPI Caitlin Costa is a 37 y.o. female.  HPI   She presents with concern for worsening symptoms from hernia, "it is out now and will go back," nausea and vomiting and abdominal pain.  The symptoms started earlier, during the night.  Prior known umbilical hernia, evaluated by general surgery, October 2018.  She was offered surgery but did not proceed at that time.  No other recent illnesses.  She denies fever, cough, chest pain, weakness or dizziness.  There are no other known modifying factors.  Past Medical History:  Diagnosis Date  . Hernia of abdominal wall     Patient Active Problem List   Diagnosis Date Noted  . Incarcerated hernia 05/31/2018    Past Surgical History:  Procedure Laterality Date  . BREAST SURGERY       OB History   None      Home Medications    Prior to Admission medications   Medication Sig Start Date End Date Taking? Authorizing Provider  Multiple Vitamin (MULITIVITAMIN WITH MINERALS) TABS Take 1 tablet by mouth daily.   Yes [provider]  benzocaine (ORAJEL) 10 % mucosal gel Use as directed in the mouth or throat as needed. For mouth pain    [provider]  HYDROcodone-acetaminophen (NORCO) 5-325 MG per tablet Take 1-2 tablets by mouth every 6 (six) hours as needed (for pain). Patient not taking: Reported on 05/31/2018 06/17/14   Molpus, Jonny RuizJohn, MD    Family History No family history on file.  Social History Social History   Tobacco Use  . Smoking status: Never Smoker  Substance Use Topics  . Alcohol use: No  . Drug use: No     Allergies   Patient has no known allergies.   Review of Systems Review of Systems  All other systems reviewed and are negative.    Physical Exam Updated Vital Signs BP 130/75 (BP  Location: Left Arm)   Pulse (!) 51   Temp 98.8 F (37.1 C)   Resp (!) 26   Ht 5\' 10"  (1.778 m)   Wt 93 kg (205 lb)   LMP 04/28/2018 (Exact Date)   SpO2 98%   BMI 29.41 kg/m   Physical Exam  Constitutional: She is oriented to person, place, and time. She appears well-developed and well-nourished. She appears distressed (Uncomfortable).  HENT:  Head: Normocephalic and atraumatic.  Eyes: Pupils are equal, round, and reactive to light. Conjunctivae and EOM are normal.  Neck: Normal range of motion and phonation normal. Neck supple.  Cardiovascular: Normal rate and regular rhythm.  Pulmonary/Chest: Effort normal and breath sounds normal. She exhibits no tenderness.  Abdominal: Soft. She exhibits no distension. There is tenderness (Moderate to severe). There is no guarding. A hernia is present.  Palpable hernia above umbilicus, approximately 7 x 4 cm, very tender to touch and not reducible.  Musculoskeletal: Normal range of motion.  Neurological: She is alert and oriented to person, place, and time. She exhibits normal muscle tone.  Skin: Skin is warm and dry.  Psychiatric: She has a normal mood and affect. Her behavior is normal. Judgment and thought content normal.  Nursing note and vitals reviewed.    ED Treatments / Results  Labs (all labs ordered are listed, but only abnormal results  are displayed) Labs Reviewed  COMPREHENSIVE METABOLIC PANEL - Abnormal; Notable for the following components:      Result Value   Glucose, Bld 123 (*)    All other components within normal limits  CBC WITH DIFFERENTIAL/PLATELET - Abnormal; Notable for the following components:   Hemoglobin 10.7 (*)    HCT 33.5 (*)    MCV 77.5 (*)    MCH 24.8 (*)    All other components within normal limits  PREGNANCY, URINE    EKG None  Radiology Ct Abdomen Pelvis W Contrast  Result Date: 05/31/2018 CLINICAL DATA:  Non reducible ventral abdominal hernia. Abdominal pain. EXAM: CT ABDOMEN AND PELVIS WITH  CONTRAST TECHNIQUE: Multidetector CT imaging of the abdomen and pelvis was performed using the standard protocol following bolus administration of intravenous contrast. CONTRAST:  ISOVUE-300 IOPAMIDOL (ISOVUE-300) INJECTION 61% COMPARISON:  05/24/2015 CT abdomen/pelvis. FINDINGS: Lower chest: No significant pulmonary nodules or acute consolidative airspace disease. Hepatobiliary: Normal liver size. Subcentimeter hypodense left liver lobe lesion is too small to characterize and not definitely changed since 05/24/2015, probably benign. No new liver lesions. Normal gallbladder with no radiopaque cholelithiasis. No biliary ductal dilatation. Pancreas: Normal, with no mass or duct dilation. Spleen: Normal size. No mass. Adrenals/Urinary Tract: Normal adrenals. Normal kidneys with no hydronephrosis and no renal mass. Normal bladder. Stomach/Bowel: Small hiatal hernia. Otherwise normal nondistended stomach. Normal caliber small bowel with no small bowel wall thickening. Normal appendix. Moderate stool throughout the large bowel, with no large bowel wall thickening or significant pericolonic fat stranding. Vascular/Lymphatic: Normal caliber abdominal aorta. Patent portal, splenic and renal veins. No pathologically enlarged lymph nodes in the abdomen or pelvis. Reproductive: Enlarged myomatous anteverted uterus with dominant 8 cm left uterine fibroid, increased from 7.5 cm on 05/24/2015 CT. No adnexal masses. Other: No pneumoperitoneum. There is a 6.0 x 3.2 x 2.8 cm supraumbilical midline ventral abdominal hernia filled with fluid density. It is difficult to definitively assess whether a small bowel loop is present within this hernia sac given the absence of oral contrast. Musculoskeletal: No aggressive appearing focal osseous lesions. IMPRESSION: 1. Supraumbilical midline ventral abdominal hernia is filled with fluid density. While favored to represent trapped ascitic fluid, it is difficult to definitively exclude the  presence of a small bowel loop within the hernia sac on this CT study given the absence of oral contrast. No evidence of bowel obstruction. No free air. 2. Moderate colonic stool, which may indicate constipation. 3. Enlarged myomatous uterus, increased in size in the interval. 4. Small hiatal hernia. Electronically Signed   By: Delbert Phenix M.D.   On: 05/31/2018 13:12    Procedures Procedures (including critical care time)  Medications Ordered in ED Medications  fentaNYL (SUBLIMAZE) injection 100 mcg ( Intravenous MAR Hold 05/31/18 1407)  0.9 %  sodium chloride infusion ( Intravenous New Bag/Given 05/31/18 0845)  promethazine (PHENERGAN) injection 6.25-12.5 mg (has no administration in time range)  HYDROmorphone (DILAUDID) injection 0.25-0.5 mg (has no administration in time range)  ketorolac (TORADOL) 30 MG/ML injection 30 mg (has no administration in time range)  acetaminophen (OFIRMEV) 10 MG/ML IV (has no administration in time range)  ondansetron (ZOFRAN) injection 4 mg (4 mg Intravenous Given 05/31/18 0844)  sodium chloride 0.9 % bolus 1,000 mL (0 mLs Intravenous Stopped 05/31/18 1003)  ondansetron (ZOFRAN) injection 4 mg (4 mg Intravenous Given 05/31/18 1127)  iopamidol (ISOVUE-300) 61 % injection 100 mL (100 mLs Intravenous Contrast Given 05/31/18 1215)     Initial Impression /  Assessment and Plan / ED Course  I have reviewed the triage vital signs and the nursing notes.  Pertinent labs & imaging results that were available during my care of the patient were reviewed by me and considered in my medical decision making (see chart for details).  Clinical Course as of Jun 01 1539  Sun May 31, 2018  4098 Requested callback from general surgery to see if they want to operate without CT imaging since this is likely an acutely incarcerated periumbilical hernia.   [EW]  1016 Normal except glucose elevated  Comprehensive metabolic panel(!) [EW]  1533 Normal except hemoglobin low and MCV low    CBC with Differential(!) [EW]  1533 Normal  Pregnancy, urine [EW]  1539 Abnormal, hernia present, intra-abdominal contents within hernia outside of abdominal wall.  Images reviewed by me  CT Abdomen Pelvis W Contrast [EW]    Clinical Course User Index [EW] Mancel Bale, MD     Patient Vitals for the past 24 hrs:  BP Temp Temp src Pulse Resp SpO2 Height Weight  05/31/18 1530 130/75 - - (!) 51 (!) 26 98 % - -  05/31/18 1515 127/79 - - (!) 54 (!) 27 100 % - -  05/31/18 1500 131/67 98.8 F (37.1 C) - 66 (!) 29 100 % - -  05/31/18 1230 127/80 - - 67 18 100 % - -  05/31/18 1200 132/77 - - (!) 55 18 100 % - -  05/31/18 1130 124/81 - - - - - - -  05/31/18 1127 119/78 - - (!) 53 18 100 % - -  05/31/18 1030 129/75 - - 60 18 100 % - -  05/31/18 1000 123/79 - - (!) 51 18 100 % - -  05/31/18 0945 - - - (!) 51 - 100 % - -  05/31/18 0930 125/84 - - (!) 53 18 100 % 5\' 10"  (1.778 m) 93 kg (205 lb)  05/31/18 0900 122/87 - - 62 18 100 % - -  05/31/18 0848 131/73 - - 62 18 100 % - -  05/31/18 0801 127/83 98.4 F (36.9 C) Oral (!) 50 15 99 % - -     Medical Decision Making: Ventral hernia with signs and symptoms of incarceration, moderate pain and apparently larger than usual.  Patient required surgical evaluation the ED.  She was sent for imaging been to the operating room for operative management.  CRITICAL CARE-no Performed by: Mancel Bale    Final Clinical Impressions(s) / ED Diagnoses   Final diagnoses:  Hernia of abdominal cavity    ED Discharge Orders    None       Mancel Bale, MD 05/31/18 [phone]

## 2018-05-31 NOTE — H&P (Signed)
Caitlin Costa is an 37 y.o. female.   Chief Complaint: Hernia HPI: Patient is a 37 year old female who comes in with 2-year history of a ventral hernia.  Patient states that the hernia became very firm overnight.  She states that she has had continued pain throughout the night.  Secondary to this she came to the ER.  Patient is been having some nausea vomiting since the pain began.  Upon evaluation in the ED she underwent laboratory studies and CT scan.  CT scan did reveal small ventral hernia.  There appears to be some incarcerated omentum.  I did review the scans personally.  Past Medical History:  Diagnosis Date  . Hernia of abdominal wall     Past Surgical History:  Procedure Laterality Date  . BREAST SURGERY      No family history on file. Social History:  reports that she has never smoked. She does not have any smokeless tobacco history on file. She reports that she does not drink alcohol or use drugs.  Allergies: No Known Allergies   (Not in a hospital admission)  Results for orders placed or performed during the hospital encounter of 05/31/18 (from the past 48 hour(s))  Comprehensive metabolic panel     Status: Abnormal   Collection Time: 05/31/18  8:50 AM  Result Value Ref Range   Sodium 139 135 - 145 mmol/L   Potassium 4.0 3.5 - 5.1 mmol/L   Chloride 108 98 - 111 mmol/L   CO2 22 22 - 32 mmol/L   Glucose, Bld 123 (H) 70 - 99 mg/dL   BUN 14 6 - 20 mg/dL   Creatinine, Ser 0.94 0.44 - 1.00 mg/dL   Calcium 9.1 8.9 - 10.3 mg/dL   Total Protein 7.4 6.5 - 8.1 g/dL   Albumin 4.0 3.5 - 5.0 g/dL   AST 25 15 - 41 U/L   ALT 21 0 - 44 U/L   Alkaline Phosphatase 56 38 - 126 U/L   Total Bilirubin 0.5 0.3 - 1.2 mg/dL   GFR calc non Af Amer >60 >60 mL/min   GFR calc Af Amer >60 >60 mL/min    Comment: (NOTE) The eGFR has been calculated using the CKD EPI equation. This calculation has not been validated in all clinical situations. eGFR's persistently <60 mL/min signify  possible Chronic Kidney Disease.    Anion gap 9 5 - 15    Comment: Performed at [facility], [place] [address] [place], [place] [address]  CBC with Differential     Status: Abnormal   Collection Time: 05/31/18  8:50 AM  Result Value Ref Range   WBC 8.5 4.0 - 10.5 K/uL   RBC 4.32 3.87 - 5.11 MIL/uL   Hemoglobin 10.7 (L) 12.0 - 15.0 g/dL   HCT 33.5 (L) 36.0 - 46.0 %   MCV 77.5 (L) 78.0 - 100.0 fL   MCH 24.8 (L) 26.0 - 34.0 pg   MCHC 31.9 30.0 - 36.0 g/dL   RDW 14.3 11.5 - 15.5 %   Platelets 173 150 - 400 K/uL   Neutrophils Relative % 83 %   Neutro Abs 7.0 1.7 - 7.7 K/uL   Lymphocytes Relative 15 %   Lymphs Abs 1.2 0.7 - 4.0 K/uL   Monocytes Relative 2 %   Monocytes Absolute 0.2 0.1 - 1.0 K/uL   Eosinophils Relative 0 %   Eosinophils Absolute 0.0 0.0 - 0.7 K/uL   Basophils Relative 0 %   Basophils Absolute 0.0 0.0 -  0.1 K/uL    Comment: Performed at [facility], [place] [address], [place], [place] [address]  Pregnancy, urine     Status: None   Collection Time: 05/31/18 11:21 AM  Result Value Ref Range   Preg Test, Ur NEGATIVE NEGATIVE    Comment:        THE SENSITIVITY OF THIS METHODOLOGY IS >20 mIU/mL. Performed at [facility], [place] [address], [place], [place] [address]    No results found.  Review of Systems  Constitutional: Negative for chills, fever and malaise/fatigue.  HENT: Negative for ear discharge, hearing loss and sore throat.   Eyes: Negative for blurred vision and discharge.  Respiratory: Negative for cough and shortness of breath.   Cardiovascular: Negative for chest pain, orthopnea and leg swelling.  Gastrointestinal: Positive for abdominal pain. Negative for constipation, diarrhea, heartburn, nausea and vomiting.  Musculoskeletal: Negative for myalgias and neck pain.  Skin: Negative for itching and rash.  Neurological: Negative for dizziness, focal weakness, seizures and loss of consciousness.    Endo/Heme/Allergies: Negative for environmental allergies. Does not bruise/bleed easily.  Psychiatric/Behavioral: Negative for depression and suicidal ideas.  All other systems reviewed and are negative.   Blood pressure 132/77, pulse (!) 55, temperature 98.4 F (36.9 C), temperature source Oral, resp. rate 18, height _0  (1.778 m), weight 93 kg (205 lb), last menstrual period 04/28/2018, SpO2 100 %. Physical Exam  Constitutional: She is oriented to person, place, and time. Vital signs are normal. She appears well-developed and well-nourished.  Conversant No acute distress  Eyes: Lids are normal. No scleral icterus.  No lid lag Moist conjunctiva  Neck: No tracheal tenderness present. No thyromegaly present.  No cervical lymphadenopathy  Cardiovascular: Normal rate, regular rhythm and intact distal pulses.  No murmur heard. Respiratory: Effort normal and breath sounds normal. She has no wheezes. She has no rales.  GI: Soft. Bowel sounds are normal. There is no hepatosplenomegaly. There is tenderness in the periumbilical area. A hernia is present. Hernia confirmed positive in the ventral area.    Neurological: She is alert and oriented to person, place, and time.  Normal gait and station  Skin: Skin is warm. No rash noted. No cyanosis. Nails show no clubbing.  Normal skin turgor  Psychiatric: Judgment normal.  Appropriate affect     Assessment/Plan 37 year old female with incarcerated ventral hernia.  1.  We will proceed to the operating for urgent diagnostic laparoscopy, possible laparotomy, ventral hernia repair plus/minus mesh. 2.All risks and benefits were discussed with the patient, to generally include infection, bleeding, damage to surrounding structures, acute and chronic nerve pain, and recurrence. Alternatives were offered and described.  All questions were answered and the patient voiced understanding of the procedure and wishes to proceed at this point.   Reyes Ivan, MD 05/31/2018, 12:49 PM

## 2018-05-31 NOTE — Op Note (Signed)
05/31/2018  3:03 PM  PATIENT:  Caitlin Costa  37 y.o. female  PRE-OPERATIVE DIAGNOSIS:  incarcerated ventral hernia  POST-OPERATIVE DIAGNOSIS:  incarcerated ventral hernia  PROCEDURE:  Procedure(s): LAPAROSCOPY DIAGNOSTIC(N/A) EXPLORATORY LAPAROTOMY  WITH PRIMARY INCARCERATED VENTRAL HERNIA REPAIR (N/A)  SURGEON:  Surgeon(s) and Role:    * Axel Filleramirez, Fallyn Munnerlyn, MD - Primary  ANESTHESIA:   local and general  EBL:  25 mL   BLOOD ADMINISTERED:none  DRAINS: none   LOCAL MEDICATIONS USED:  BUPIVICAINE   SPECIMEN:  No Specimen  DISPOSITION OF SPECIMEN:  N/A  COUNTS:  YES  TOURNIQUET:  * No tourniquets in log *  DICTATION: .Dragon Dictation  Details of the procedure:   After the patient was consented patient was taken back to the operating room patient was then placed in supine position bilateral SCDs in place.  The patient was prepped and draped in the usual sterile fashion. After antibiotics were confirmed a timeout was called and all facts were verified. The Veress needle technique was used to insuflate the abdomen at Palmer's point. The abdomen was insufflated to 14 mm mercury. Subsequently a 5 mm trocar was placed a camera inserted there was no injury to any intra-abdominal organs.    There was seen to be an incarcerated primary supra umbilical ventral hernia.  A second camera port was in placed into the left lower quadrant.   At this the Falicform ligament was taken down with Bovie cautery maintaining hemostasis. A 5mm port was placed in the epigastrium .  The hernia contents were attempted to bluntly be reduced from the hernia.  However the hernia was very small.  With external manual pressure the hernia was able to be reduced.  There is large amount of ascitic fluid within the hernia sac.  There was some bruising however there was no ischemia to the bowel itself.  To further inspect the bowel and to primary close the hernia I decided to make a small incision on the skin.  At  this time a #10 blade was used to make a small approximately 4 cm incision over the area of the hernia.  Cautery was used to maintain hemostasis and dissection was taken down the hernia sac.  The hernia sac was circumferentially dissected away from subcutaneous tissue.  This was excised.  At this time the small bowel was incarcerated hernia was extracted.  This is visualized.  There is no ischemia to any parts of the bowel.  The serosa was intact.  At this time the bowel was placed within the abdominal cavity.  At this time I decided to primarily repair the hernia.  This was done with #1 Novafils in an interrupted fashion.  The omentum was brought over the area of the mesh. The pneumoperitoneum was evacuated  & all trocars  were removed. The skin was reapproximated with 4-0  Monocryl sutures in a subcuticular fashion. The skin was dressed with Dermabond.  The patient was taken to the recovery room in stable condition.  Type of repair - Primary    PLAN OF CARE: Admit for overnight observation  PATIENT DISPOSITION:  PACU - hemodynamically stable.   Delay start of Pharmacological VTE agent (>24hrs) due to surgical blood loss or risk of bleeding: not applicable

## 2018-05-31 NOTE — Anesthesia Procedure Notes (Signed)
Procedure Name: Intubation Performed by: Kizzie Fantasiaarver, Kristalynn Coddington J, CRNA Pre-anesthesia Checklist: Patient identified, Emergency Drugs available, Suction available, Patient being monitored and Timeout performed Patient Re-evaluated:Patient Re-evaluated prior to induction Oxygen Delivery Method: Circle system utilized Preoxygenation: Pre-oxygenation with 100% oxygen Induction Type: IV induction and Rapid sequence Grade View: Grade I Tube type: Oral Tube size: 7.0 mm Number of attempts: 1 Airway Equipment and Method: Stylet Placement Confirmation: ETT inserted through vocal cords under direct vision,  positive ETCO2,  CO2 detector and breath sounds checked- equal and bilateral Secured at: 21 cm Tube secured with: Tape Dental Injury: Teeth and Oropharynx as per pre-operative assessment

## 2018-06-01 ENCOUNTER — Encounter (HOSPITAL_COMMUNITY): Payer: Self-pay | Admitting: General Surgery

## 2018-06-01 MED ORDER — ACETAMINOPHEN 500 MG PO TABS
1000.0000 mg | ORAL_TABLET | Freq: Three times a day (TID) | ORAL | Status: DC
  Administered 2018-06-01: 1000 mg via ORAL

## 2018-06-01 MED ORDER — IBUPROFEN 200 MG PO TABS: ORAL_TABLET | ORAL | Status: AC

## 2018-06-01 MED ORDER — ACETAMINOPHEN 325 MG PO TABS: 650.0000 mg | ORAL_TABLET | Freq: Four times a day (QID) | ORAL | Status: DC | PRN

## 2018-06-01 MED ORDER — ACETAMINOPHEN 500 MG PO TABS: ORAL_TABLET | ORAL | 0 refills | Status: AC

## 2018-06-01 MED ORDER — TRAMADOL HCL 50 MG PO TABS: 50.0000 mg | ORAL_TABLET | Freq: Four times a day (QID) | ORAL | 0 refills | Status: AC | PRN

## 2018-06-01 NOTE — Progress Notes (Signed)
S: tolerating liquids, ambulating, pain moderate O: BP 117/84 (BP Location: Left Arm)   Pulse (!) 59   Temp 99.4 F (37.4 C) (Oral)   Resp 18   Ht 5\' 10"  (1.778 m)   Wt 93 kg (205 lb)   LMP 04/28/2018 (Exact Date)   SpO2 98%   BMI 29.41 kg/m  Gen: NAD Neuro: AOx4 Ab: soft, ATTP, incisions c/d/i  A/P POD 1 diag lap and primary ventral hernia repair for incarceration -f/u breakfast tolerance -home today

## 2018-06-01 NOTE — Discharge Instructions (Signed)
[facility] _______[facility] ° °UMBILICAL OR INGUINAL HERNIA REPAIR: POST OP INSTRUCTIONS ° °Always review your discharge instruction sheet given to you by the facility where your surgery was performed. °IF YOU HAVE DISABILITY OR FAMILY LEAVE FORMS, YOU MUST BRING THEM TO THE OFFICE FOR PROCESSING.   °DO NOT GIVE THEM TO YOUR DOCTOR. ° °1. A  prescription for pain medication may be given to you upon discharge.  Take your pain medication as prescribed, if needed.  If narcotic pain medicine is not needed, then you may take acetaminophen (Tylenol) or ibuprofen (Advil) as needed. °2. Take your usually prescribed medications unless otherwise directed. °If you need a refill on your pain medication, please contact your pharmacy.  They will contact our office to request authorization. Prescriptions will not be filled after 5 pm or on week-ends. °3. You should follow a light diet the first 24 hours after arrival home, such as soup and crackers, etc.  Be sure to include lots of fluids daily.  Resume your normal diet the day after surgery. °4.Most patients will experience some swelling and bruising around the umbilicus or in the groin and scrotum.  Ice packs and reclining will help.  Swelling and bruising can take several days to resolve.  °6. It is common to experience some constipation if taking pain medication after surgery.  Increasing fluid intake and taking a stool softener (such as Colace) will usually help or prevent this problem from occurring.  A mild laxative (Milk of Magnesia or Miralax) should be taken according to package directions if there are no bowel movements after 48 hours. °7. Unless discharge instructions indicate otherwise, you may remove your bandages 24-48 hours after surgery, and you may shower at that time.  You may have steri-strips (small skin tapes) in place directly over the incision.  These strips should be left on the skin for 7-10 days.  If your surgeon used skin glue on the  incision, you may shower in 24 hours.  The glue will flake off over the next 2-3 weeks.  Any sutures or staples will be removed at the office during your follow-up visit. °8. ACTIVITIES:  You may resume regular (light) daily activities beginning the next day--such as daily self-care, walking, climbing stairs--gradually increasing activities as tolerated.  You may have sexual intercourse when it is comfortable.  Refrain from any heavy lifting or straining until approved by your doctor. ° °a.You may drive when you are no longer taking prescription pain medication, you can comfortably wear a seatbelt, and you can safely maneuver your car and apply brakes. °b.RETURN TO WORK:   °_____________________________________________ ° °9.You should see your doctor in the office for a follow-up appointment approximately 2-3 weeks after your surgery.  Make sure that you call for this appointment within a day or two after you arrive home to insure a convenient appointment time. °10.OTHER INSTRUCTIONS: _________________________ °   _____________________________________ ° °WHEN TO CALL YOUR DOCTOR: °1. Fever over 101.0 °2. Inability to urinate °3. Nausea and/or vomiting °4. Extreme swelling or bruising °5. Continued bleeding from incision. °6. Increased pain, redness, or drainage from the incision ° °The clinic staff is available to answer your questions during regular business hours.  Please don’t hesitate to call and ask to speak to one of the nurses for clinical concerns.  If you have a medical emergency, go to the nearest emergency room or call 911.  A surgeon from [facility] is always on call at the hospital ° ° °  [address], [address], [place],   [address] ? ° [address] [address], [place],    [address] °[phone] ? [phone] ? FAX [phone] °Web site: [email or web address] °

## 2018-06-01 NOTE — Progress Notes (Signed)
Discharge and medication instructions reviewed with patient; questions answered and patient denies further questions. One prescription given and family is here to drive her home. Lina SarBeth Praneeth Bussey, RN

## 2018-06-01 NOTE — Discharge Summary (Signed)
Physician Discharge Summary  Patient ID: Caitlin Costa MRN: [id number] DOB/AGE: 36-Jan-1982 35 y.o.  Admit date: 05/31/2018 Discharge date: 06/01/2018  Admission Diagnoses:  Incarcerated ventral hernia  Discharge Diagnoses:  Incarcerated ventral hernia  Active Problems:   Incarcerated hernia   PROCEDURES: Diagnostic laparoscopy, exploratory laparotomy with primary incarcerated ventral hernia repair, 05/31/2018 Dr. Axel Filler.  Hospital Course:  Chief Complaint: Hernia HPI: Patient is a 37 year old female who comes in with 2-year history of a ventral hernia.  Patient states that the hernia became very firm overnight.  She states that she has had continued pain throughout the night.  Secondary to this she came to the ER.  Patient is been having some nausea vomiting since the pain began.  Upon evaluation in the ED she underwent laboratory studies and CT scan.  CT scan did reveal small ventral hernia.  There appears to be some incarcerated omentum.  I did review the scans personally.  Patient was seen and evaluated by Dr. Derrell Lolling and admitted.  She was taken the operating room that afternoon.  First postop day she was doing well.  Her diet was being advanced she is tolerating liquids and fill easily with soft foods.  I am going to let her go home.  Follow up as noted below.  CBC Latest Ref Rng & Units 05/31/2018 04/10/2007  WBC 4.0 - 10.5 K/uL 8.5 6.3  Hemoglobin 12.0 - 15.0 g/dL 10.7(L) 13.1  Hematocrit 36.0 - 46.0 % 33.5(L) 39.3  Platelets 150 - 400 K/uL 173 137(L)   CMP Latest Ref Rng & Units 05/31/2018  Glucose 70 - 99 mg/dL 409(W)  BUN 6 - 20 mg/dL 14  Creatinine 1.19 - 1.47 mg/dL 8.29  Sodium 562 - 130 mmol/L 139  Potassium 3.5 - 5.1 mmol/L 4.0  Chloride 98 - 111 mmol/L 108  CO2 22 - 32 mmol/L 22  Calcium 8.9 - 10.3 mg/dL 9.1  Total Protein 6.5 - 8.1 g/dL 7.4  Total Bilirubin 0.3 - 1.2 mg/dL 0.5  Alkaline Phos 38 - 126 U/L 56  AST 15 - 41 U/L 25  ALT 0 - 44 U/L 21          Prior to Admission medications   Medication Sig Start Date End Date Taking? Authorizing Provider  Multiple Vitamin (MULITIVITAMIN WITH MINERALS) TABS Take 1 tablet by mouth daily.   Yes [provider]  benzocaine (ORAJEL) 10 % mucosal gel Use as directed in the mouth or throat as needed. For mouth pain    [provider]  HYDROcodone-acetaminophen (NORCO) 5-325 MG per tablet Take 1-2 tablets by mouth every 6 (six) hours as needed (for pain). Patient not taking: Reported on 05/31/2018 06/17/14   Molpus, John, MD    Disposition:    Allergies as of 06/01/2018   No Known Allergies     Medication List    STOP taking these medications   HYDROcodone-acetaminophen 5-325 MG tablet Commonly known as:  NORCO     TAKE these medications   acetaminophen 500 MG tablet Commonly known as:  TYLENOL Take 2 tablets every 6 hours as needed for pain.  This is your first-line pain medication.  If this is ineffective and you can take the tramadol. Do not exceed 4000 mg of Tylenol per day it can harm your liver.  You can buy this over-the-counter at any drugstore.   benzocaine 10 % mucosal gel Commonly known as:  ORAJEL Use as directed in the mouth or throat as needed. For mouth pain  ibuprofen 200 MG tablet Commonly known as:  ADVIL,MOTRIN Your can take 2-3 tablets every 6 hours for pain not relieved by Tylenol.   If you take Tylenol a 7 AM and you are still uncomfortable you can take the ibuprofen at 9 AM.  If both the Tylenol and ibuprofen do not relieve your pain you can use the Ultram.    You can buy this over the counter at any drug store without a prescription.   multivitamin with minerals Tabs tablet Take 1 tablet by mouth daily.   traMADol 50 MG tablet Commonly known as:  ULTRAM Take 1 tablet (50 mg total) by mouth every 6 (six) hours as needed (mild pain).      Follow-up Information    Axel Filleramirez, Armando, MD Follow up.   Specialty:  General  Surgery Why:  Your appointment is at 4 PM.  Be at the office 30 minutes early for check-in.  Bring photo ID and insurance information. Contact information: [address] [place] [address] [phone]           Signed: Sherrie GeorgeJENNINGS,Luan Maberry 06/01/2018, 11:53 AM

## 2018-06-01 NOTE — Progress Notes (Signed)
Paged Dr Sheliah HatchKinsinger about patient not tolerating her food. Patient is drinking fluids. Dr Sheliah HatchKinsinger states she is able to go home. Lina SarBeth Geonna Lockyer, RN
